# Patient Record
Sex: Female | Born: 1955 | Hispanic: No | Marital: Married | Smoking: Never smoker
Health system: Southern US, Community
[De-identification: ages and names within clinical notes are randomized; demographics above are authoritative.]

## PROBLEM LIST (undated history)

## (undated) DIAGNOSIS — G43909 Migraine, unspecified, not intractable, without status migrainosus: Secondary | ICD-10-CM

## (undated) DIAGNOSIS — I10 Essential (primary) hypertension: Secondary | ICD-10-CM

## (undated) HISTORY — DX: Migraine, unspecified, not intractable, without status migrainosus: G43.909

## (undated) HISTORY — PX: DILATION AND CURETTAGE OF UTERUS: SHX78

## (undated) HISTORY — DX: Essential (primary) hypertension: I10

---

## 2002-06-21 ENCOUNTER — Encounter: Admission: RE | Admit: 2002-06-21 | Discharge: 2002-06-21 | Payer: Self-pay | Admitting: Emergency Medicine

## 2002-06-21 ENCOUNTER — Encounter: Payer: Self-pay | Admitting: Emergency Medicine

## 2003-03-10 ENCOUNTER — Encounter: Payer: Self-pay | Admitting: Gastroenterology

## 2003-03-10 ENCOUNTER — Ambulatory Visit (HOSPITAL_COMMUNITY): Admission: RE | Admit: 2003-03-10 | Discharge: 2003-03-10 | Payer: Self-pay | Admitting: Gastroenterology

## 2005-03-11 ENCOUNTER — Encounter: Admission: RE | Admit: 2005-03-11 | Discharge: 2005-03-11 | Payer: Self-pay | Admitting: Emergency Medicine

## 2006-03-03 ENCOUNTER — Other Ambulatory Visit: Admission: RE | Admit: 2006-03-03 | Discharge: 2006-03-03 | Payer: Self-pay | Admitting: Obstetrics and Gynecology

## 2006-03-11 ENCOUNTER — Encounter: Admission: RE | Admit: 2006-03-11 | Discharge: 2006-03-11 | Payer: Self-pay | Admitting: Obstetrics and Gynecology

## 2006-04-25 ENCOUNTER — Encounter (INDEPENDENT_AMBULATORY_CARE_PROVIDER_SITE_OTHER): Payer: Self-pay | Admitting: Specialist

## 2006-04-25 ENCOUNTER — Ambulatory Visit (HOSPITAL_COMMUNITY): Admission: RE | Admit: 2006-04-25 | Discharge: 2006-04-25 | Payer: Self-pay | Admitting: Obstetrics and Gynecology

## 2007-12-24 ENCOUNTER — Ambulatory Visit (HOSPITAL_COMMUNITY): Admission: RE | Admit: 2007-12-24 | Discharge: 2007-12-24 | Payer: Self-pay | Admitting: Gastroenterology

## 2008-08-04 ENCOUNTER — Other Ambulatory Visit: Admission: RE | Admit: 2008-08-04 | Discharge: 2008-08-04 | Payer: Self-pay | Admitting: Obstetrics & Gynecology

## 2008-12-30 ENCOUNTER — Ambulatory Visit (HOSPITAL_COMMUNITY): Admission: RE | Admit: 2008-12-30 | Discharge: 2008-12-30 | Payer: Self-pay | Admitting: Obstetrics & Gynecology

## 2010-09-09 ENCOUNTER — Encounter: Payer: Self-pay | Admitting: Obstetrics & Gynecology

## 2010-09-09 ENCOUNTER — Encounter: Payer: Self-pay | Admitting: Gastroenterology

## 2011-01-04 NOTE — H&P (Signed)
Alexandria Lawrence, Alexandria Lawrence                 ACCOUNT NO.:  [id number]   MEDICAL RECORD NO.:  [id number]          PATIENT TYPE:  AMB   LOCATION:  SDC                           FACILITY:  [facility]   PHYSICIAN:  Naima A. Dillard, M.D. DATE OF BIRTH:  1956-04-09   DATE OF ADMISSION:  04/24/2006  DATE OF DISCHARGE:                                HISTORY & PHYSICAL   CHIEF COMPLAINT:  Irregular bleeding, submucosal fibroid.   PROCEDURE:  The patient is a 55 year old gravida 3, para 1 who presented on  July of 2007 stating that she has had pain with periods, lethargy and body  aches.  She is totally dysfunctional one week before period and also has  camping, bloating and headaches of three days into the period.  The patient  says that she has a period for seven days.  She changes her pad about four  times a day.  The cramps are about 3 to 7/10 and she does take Midol and  Advil which does decrease it to a 5/10.  The patient does not use any  contraception.  A submucosal fibroid was seen on sonohistogram.  She is not  on any hormone therapy.  She is not on any new medications for menopausal  symptoms.  Does not have any vaginal discharge.  She does have dysmenorrhea  and has increased stress because her son just moved back in from college.   PAST MEDICAL HISTORY:  Significant for migraines and hypercholesterolemia.   PAST SURGICAL HISTORY:  Significant for elective abortion x1 in the first  trimester.  Tobacco abuse.  She has under care for a vaginal delivery x1,  miscarriage x1 in the first trimester and elective abortion x1 in the first  trimester.   ALLERGIES:  The patient has no known drug allergies.   MEDICATIONS:  Topamax 2.5 mg q.h.s.   SOCIAL HISTORY:  She has occasional alcohol use.  No tobacco or illicit drug  use.   FAMILY HISTORY:  No diabetes.  Both parents have hypertension.  She has a  maternal aunt with breast cancer.   REVIEW OF SYSTEMS:  CARDIOVASCULAR:  She does have some heart  palpitations.  GI:  She has no constipation.  MUSCULOSKELETAL:  No weakness.  ENDOCRINE:  She has increased prolactin level.  PSYCHIATRIC:  Unremarkable.   PHYSICAL EXAMINATION:  VITAL SIGNS:  Blood pressure on repeat was 100/60.  Weight is 144 pounds.  Pupils are equal.  Hearing normal.  Throat is clear.  Thyroid is not  enlarged.  Heart has a regular rate and rhythm.  Lungs are clear to  auscultation bilaterally.  Back has no CVA tenderness bilaterally.  Abdomen  is nontender without any masses or organomegaly.  Extremities have no  clubbing, cyanosis, or edema.  Vaginal exam is within normal limits.  Cervix  is nontender without any lesions.  Uterus is normal size, mobile, nontender.  No adnexal masses.   ASSESSMENT:  Irregular vaginal bleeding with submucosal fibers,  perimenopausal with premenstrual syndrome.   PLAN:  A D&C hysteroscopy with removal of fibroids.  The patient was  given  Cytotec 200 mcg to place in her vagina six hours before the procedure.  The  patient declines any treatment for PMS.  The patient did have a slightly  increased prolactin level.  We will plan to check it preoperatively.  The  patient understands the risks are but not limited to bleeding, infection,  perforation of the uterus, need for expected surgery.      Naima A. Normand Sloop, M.D.  Electronically Signed     NAD/MEDQ  D:  04/24/2006  T:  04/24/2006  Job:  [id number]

## 2011-01-04 NOTE — Op Note (Signed)
Alexandria Lawrence, Alexandria Lawrence                 ACCOUNT NO.:  [id number]   MEDICAL RECORD NO.:  [id number]          PATIENT TYPE:  AMB   LOCATION:  SDC                           FACILITY:  [facility]   PHYSICIAN:  Naima A. Dillard, M.D. DATE OF BIRTH:  [birth year]   DATE OF PROCEDURE:  04/25/2006  DATE OF DISCHARGE:                                 OPERATIVE REPORT   PREOPERATIVE DIAGNOSES:  1. Irregular heavy vaginal bleeding.  2. Submucosal fibroid.   POSTOPERATIVE DIAGNOSES:  1. Irregular heavy vaginal bleeding.  2. Submucosal fibroid.  3. Cervical stenosis.   PROCEDURE:  D&C hysteroscopy, endometrial ablation with ThermaChoice.   SURGEON:  Naima A. Dillard, M.D.   ASSISTANT:  None.   ANESTHESIA:  General laryngeal mask airway and local.   SPECIMENS:  Endometrial curettings sent to pathology.   ESTIMATED BLOOD LOSS:  Minimal.   COMPLICATIONS:  None.   There was a 50 mL of deficit of LR. The patient went to PACU in stable  condition.   DESCRIPTION OF PROCEDURE:  The patient was taken to the operating room where  she was given general anesthesia, placed in dorsal lithotomy position,  prepped and draped in a normal sterile fashion. A bivalve speculum was  placed into the vagina, the anterior lip of the cervix was grasped with a  single tooth tenaculum. The cervix was found to be completely stenotic and  was able to place the sound in the uterus and the uterus did sound to 7.5  cm. The cervical os was 3.5 cm. The cervix was further dilated with [facility]  dilators. The hysteroscope was placed into the uterine cavity, both ostia  were seen. You could not really see the indentation of the submucosal  fibroid as well with the hysteroscope even when you decrease the pressure it  looked small, questionable indentation but no area that looked like it would  benefit from a hysteroscopic resection. The hysteroscope was removed and  endometrial curetting was done with a sharp curettage. The NovaSure  device  was then placed into the cervix after dilating up to a 27 but because the  uterus had a narrow cavity, the fan devicewould not deploy. I tried several  times to deploy the device, and it did not deploy. It would deploy outside  of the uterus so we knew the device was intact. I then looked again into the  uterus just to make sure that there were no perforations and there were no  perforations seen. We then obtained consent from her husband to use  ThermaChoice. A ThermaChoice device was then brought into the room.  Instructions were followed carefully and ThermaChoice ablation was done  without difficulty for 8 minutes. All  instruments were removed from the vagina. The tenaculum site had some  bleeding on the left side of the anterior lip of the cervix which was made  hemostatic with sliver nitrate. Sponge, lap and needle counts were correct.  The patient went to the recovery room in stable condition.      Naima A. Normand Sloop, M.D.  Electronically Signed  NAD/MEDQ  D:  04/25/2006  T:  04/25/2006  Job:  [id number]

## 2012-11-10 ENCOUNTER — Encounter: Payer: Self-pay | Admitting: *Deleted

## 2012-11-10 ENCOUNTER — Ambulatory Visit (INDEPENDENT_AMBULATORY_CARE_PROVIDER_SITE_OTHER): Payer: 59 | Admitting: Neurology

## 2012-11-10 ENCOUNTER — Encounter: Payer: Self-pay | Admitting: Neurology

## 2012-11-10 VITALS — BP 160/60 | HR 60 | Ht 64.5 in | Wt 149.0 lb

## 2012-11-10 DIAGNOSIS — R519 Headache, unspecified: Secondary | ICD-10-CM | POA: Insufficient documentation

## 2012-11-10 DIAGNOSIS — G43909 Migraine, unspecified, not intractable, without status migrainosus: Secondary | ICD-10-CM | POA: Insufficient documentation

## 2012-11-10 DIAGNOSIS — R51 Headache: Secondary | ICD-10-CM

## 2012-11-10 NOTE — Progress Notes (Signed)
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 She has had good results with prophylaxis with Topamax and symptomatic relief with Maxalt.  She returns for followup after last visit on 08/03/2012. She state she's noticed increasing frequency of headaches in the last 2 months. In the month of February she had 3 severe migraine headaches and has already had 7 severe migraines in March. She's also had 5 days of mild  headaches in February as well which responded to ibuprofen. She is taking Topamax 25 mg daily and takes Maxalt 10 mg for symptomatic relief. She is unable to identify clear triggers for her headaches except possibly  lack of sleep and some recent work stress. She also had blood pressure documented in office today of 153/86 and 160/100. She does not have diagnoses of hypertension but does admit that at last visit with her primary physician Dr. Waynard Edwards her blood pressure was slightly high. ROS: 14 system review of systems is positive only for headache. Physical Exam: General: well developed, well nourished, seated, in no evident distress Head: head normocephalic and atraumatic. Orohparynx benign Neck: supple with no carotid or supraclavicular bruits Cardiovascular: regular rate and rhythm, no murmurs  Neurologic Exam Mental Status: Awake and fully alert. Oriented to place and time. Recent and remote memory intact. Attention span, concentration and fund of knowledge appropriate. Mood and affect appropriate.  Cranial Nerves: Fundoscopic exam not done. Pupils equal, briskly reactive to light. Extraocular movements full without nystagmus. Visual fields full to confrontation. Hearing intact   Facial sensation intact. Face, tongue, palate move normally and symmetrically. Neck flexion and extension normal.  Motor: Normal bulk and tone. Normal strength in all tested extremity  muscles. Sensory.: intact to tough and pinprick and vibratory.  Coordination: Rapid alternating movements normal in all extremities. Finger-to-nose and heel-to-shin performed accurately bilaterally. Gait and Station: Arises from chair without difficulty. Stance is normal. Gait demonstrates normal stride length and balance .   Reflexes: 1+ and symmetric. Toes downgoing.     ASSESSMENT:: 57 year lady with Mixed migraine and tension headaches which seem to have gotten worse in the last couple of months New diagnosis of hypertension    PLAN: Increase Topamax to 25 mg twice a day if tolerated without side effects and if needed may increase the dose further. I discussed possible side effects with her and advised her to call me. Continue to use Maxalt for symptomatic relief and I gave her some samples of Relpax to guide her through to till her mail order prescription comes in. I advised her to participate in stresses relaxation activities like regular walking, exercise, meditation and yoga. I also encouraged her to sleep at least 8 hours every night. She was advised to maintain a headache diary as well as recall her blood pressure daily and follow up with her primary physician for treatment for hypertension. She was advised to return for followup with me in 3 months.

## 2012-11-10 NOTE — Patient Instructions (Signed)
She is having increasing frequency of headaches hands plan to increase Topamax to 50 twice a day and further is tolerated and needed. I gave her samples of Relpax 40 mg tablets to tried her over till her mail order prescription of Maxalt 10 mg comes back. I also encouraged her to increase participation in stresses relaxation at today's like regular exercise, swimming, meditation and yoga. I was also concerned about her blood pressure being elevated and have advised her to monitor it and make an appointment to see her primary physician to discuss starting antihypertensives. She was asked to return for followup in 3 months.

## 2012-11-11 ENCOUNTER — Other Ambulatory Visit: Payer: Self-pay

## 2012-11-11 MED ORDER — RIZATRIPTAN BENZOATE 10 MG PO TABS: 10.0000 mg | ORAL_TABLET | ORAL | Status: DC | PRN

## 2012-11-11 NOTE — Progress Notes (Signed)
This encounter was created in error - please disregard.

## 2013-08-11 ENCOUNTER — Telehealth: Payer: Self-pay | Admitting: *Deleted

## 2013-08-11 ENCOUNTER — Ambulatory Visit (INDEPENDENT_AMBULATORY_CARE_PROVIDER_SITE_OTHER): Payer: 59

## 2013-08-11 ENCOUNTER — Ambulatory Visit (INDEPENDENT_AMBULATORY_CARE_PROVIDER_SITE_OTHER): Payer: 59 | Admitting: Podiatry

## 2013-08-11 ENCOUNTER — Encounter: Payer: Self-pay | Admitting: Podiatry

## 2013-08-11 VITALS — BP 129/96 | HR 108 | Resp 16 | Ht 64.0 in | Wt 150.0 lb

## 2013-08-11 DIAGNOSIS — M7750 Other enthesopathy of unspecified foot: Secondary | ICD-10-CM

## 2013-08-11 DIAGNOSIS — M722 Plantar fascial fibromatosis: Secondary | ICD-10-CM

## 2013-08-11 MED ORDER — TRIAMCINOLONE ACETONIDE 10 MG/ML IJ SUSP
10.0000 mg | Freq: Once | INTRAMUSCULAR | Status: AC
  Administered 2013-08-11: 10 mg

## 2013-08-11 NOTE — Progress Notes (Signed)
   Subjective:    Patient ID: Alexandria Lawrence, female    DOB: Jan 14, 1956, 57 y.o.   MRN: [id number]  HPI Comments: i have a bone spur and plantar fasciitis N sharp  L left foot medial side of foot and plantar heel  D July  O when in [place] did a lot of walking with bad shoes on  C about the same  A first thing in the morning , after resting first step up and after being on it  T cortisone injection in October by dr duda and ice and stretches     Foot Pain Associated symptoms include coughing, fatigue, a fever, headaches and weakness.      Review of Systems  Constitutional: Positive for fever and fatigue.       Sweating   HENT:       Sore throat   Respiratory: Positive for cough.   Cardiovascular:       Pain in calf when walking   Musculoskeletal:       Difficulty walking   Neurological: Positive for weakness and headaches.  All other systems reviewed and are negative.       Objective:   Physical Exam        Assessment & Plan:

## 2013-08-11 NOTE — Progress Notes (Signed)
Subjective:     Patient ID: Alexandria Lawrence, female   DOB: Apr 01, 1956, 57 y.o.   MRN: [id number]  Foot Pain   patient presents stating I'm having severe pain in my left plantar heel of approximately 6 months duration. States it's very sore when she gets up in the morning and after periods of sitting and is also now developing pain in the medial side of the foot. Patient's daughter is getting married in May and she has to be better for this event   Review of Systems  All other systems reviewed and are negative.       Objective:   Physical Exam  Nursing note and vitals reviewed. Constitutional: She is oriented to person, place, and time.  Cardiovascular: Intact distal pulses.   Musculoskeletal: Normal range of motion.  Neurological: She is oriented to person, place, and time.  Skin: Skin is warm.   neurovascular status intact and pain to palpation left plantar heel at the insertion of the tendon into the calcaneus with inflammation and fluid buildup. I also noted there to be discomfort at the posterior tibial insertion left with no muscle strength loss or range of motion loss. Patient does have a fever today that I made her aware of     Assessment:     Acute plantar fasciitis left with inflammation and compensation creating posterior tibial tendinitis left    Plan:     H&P reviewed with patient and today I injected the left plantar fascia 3 mg Kenalog 5 mg Xylocaine Marcaine mixture and dispensed night splint with all instructions on usage. Reappoint in 1 week to reevaluate and patient will take it easy today due to fever and see her family physician if any issues should occur with that

## 2013-08-11 NOTE — Patient Instructions (Addendum)
Plantar Fasciitis (Heel Spur Syndrome) with Rehab The plantar fascia is a fibrous, ligament-like, soft-tissue structure that spans the bottom of the foot. Plantar fasciitis is a condition that causes pain in the foot due to inflammation of the tissue. SYMPTOMS   Pain and tenderness on the underneath side of the foot.  Pain that worsens with standing or walking. CAUSES  Plantar fasciitis is caused by irritation and injury to the plantar fascia on the underneath side of the foot. Common mechanisms of injury include:  Direct trauma to bottom of the foot.  Damage to a small nerve that runs under the foot where the main fascia attaches to the heel bone.  Stress placed on the plantar fascia due to bone spurs. RISK INCREASES WITH:   Activities that place stress on the plantar fascia (running, jumping, pivoting, or cutting).  Poor strength and flexibility.  Improperly fitted shoes.  Tight calf muscles.  Flat feet.  Failure to warm-up properly before activity.  Obesity. PREVENTION  Warm up and stretch properly before activity.  Allow for adequate recovery between workouts.  Maintain physical fitness:  Strength, flexibility, and endurance.  Cardiovascular fitness.  Maintain a health body weight.  Avoid stress on the plantar fascia.  Wear properly fitted shoes, including arch supports for individuals who have flat feet. PROGNOSIS  If treated properly, then the symptoms of plantar fasciitis usually resolve without surgery. However, occasionally surgery is necessary. RELATED COMPLICATIONS   Recurrent symptoms that may result in a chronic condition.  Problems of the lower back that are caused by compensating for the injury, such as limping.  Pain or weakness of the foot during push-off following surgery.  Chronic inflammation, scarring, and partial or complete fascia tear, occurring more often from repeated injections. TREATMENT  Treatment initially involves the use of  ice and medication to help reduce pain and inflammation. The use of strengthening and stretching exercises may help reduce pain with activity, especially stretches of the Achilles tendon. These exercises may be performed at home or with a therapist. Your caregiver may recommend that you use heel cups of arch supports to help reduce stress on the plantar fascia. Occasionally, corticosteroid injections are given to reduce inflammation. If symptoms persist for greater than 6 months despite non-surgical (conservative), then surgery may be recommended.  MEDICATION   If pain medication is necessary, then nonsteroidal anti-inflammatory medications, such as aspirin and ibuprofen, or other minor pain relievers, such as acetaminophen, are often recommended.  Do not take pain medication within 7 days before surgery.  Prescription pain relievers may be given if deemed necessary by your caregiver. Use only as directed and only as much as you need.  Corticosteroid injections may be given by your caregiver. These injections should be reserved for the most serious cases, because they may only be given a certain number of times. HEAT AND COLD  Cold treatment (icing) relieves pain and reduces inflammation. Cold treatment should be applied for 10 to 15 minutes every 2 to 3 hours for inflammation and pain and immediately after any activity that aggravates your symptoms. Use ice packs or massage the area with a piece of ice (ice massage).  Heat treatment may be used prior to performing the stretching and strengthening activities prescribed by your caregiver, physical therapist, or athletic trainer. Use a heat pack or soak the injury in warm water. SEEK IMMEDIATE MEDICAL CARE IF:  Treatment seems to offer no benefit, or the condition worsens.  Any medications produce adverse side effects. EXERCISES RANGE   OF MOTION (ROM) AND STRETCHING EXERCISES - Plantar Fasciitis (Heel Spur Syndrome) These exercises may help you  when beginning to rehabilitate your injury. Your symptoms may resolve with or without further involvement from your physician, physical therapist or athletic trainer. While completing these exercises, remember:   Restoring tissue flexibility helps normal motion to return to the joints. This allows healthier, less painful movement and activity.  An effective stretch should be held for at least 30 seconds.  A stretch should never be painful. You should only feel a gentle lengthening or release in the stretched tissue. RANGE OF MOTION - Toe Extension, Flexion  Sit with your right / left leg crossed over your opposite knee.  Grasp your toes and gently pull them back toward the top of your foot. You should feel a stretch on the bottom of your toes and/or foot.  Hold this stretch for __________ seconds.  Now, gently pull your toes toward the bottom of your foot. You should feel a stretch on the top of your toes and or foot.  Hold this stretch for __________ seconds. Repeat __________ times. Complete this stretch __________ times per day.  RANGE OF MOTION - Ankle Dorsiflexion, Active Assisted  Remove shoes and sit on a chair that is preferably not on a carpeted surface.  Place right / left foot under knee. Extend your opposite leg for support.  Keeping your heel down, slide your right / left foot back toward the chair until you feel a stretch at your ankle or calf. If you do not feel a stretch, slide your bottom forward to the edge of the chair, while still keeping your heel down.  Hold this stretch for __________ seconds. Repeat __________ times. Complete this stretch __________ times per day.  STRETCH  Gastroc, Standing  Place hands on wall.  Extend right / left leg, keeping the front knee somewhat bent.  Slightly point your toes inward on your back foot.  Keeping your right / left heel on the floor and your knee straight, shift your weight toward the wall, not allowing your back to  arch.  You should feel a gentle stretch in the right / left calf. Hold this position for __________ seconds. Repeat __________ times. Complete this stretch __________ times per day. STRETCH  Soleus, Standing  Place hands on wall.  Extend right / left leg, keeping the other knee somewhat bent.  Slightly point your toes inward on your back foot.  Keep your right / left heel on the floor, bend your back knee, and slightly shift your weight over the back leg so that you feel a gentle stretch deep in your back calf.  Hold this position for __________ seconds. Repeat __________ times. Complete this stretch __________ times per day. STRETCH  Gastrocsoleus, Standing  Note: This exercise can place a lot of stress on your foot and ankle. Please complete this exercise only if specifically instructed by your caregiver.   Place the ball of your right / left foot on a step, keeping your other foot firmly on the same step.  Hold on to the wall or a rail for balance.  Slowly lift your other foot, allowing your body weight to press your heel down over the edge of the step.  You should feel a stretch in your right / left calf.  Hold this position for __________ seconds.  Repeat this exercise with a slight bend in your right / left knee. Repeat __________ times. Complete this stretch __________ times per day.    STRENGTHENING EXERCISES - Plantar Fasciitis (Heel Spur Syndrome)  These exercises may help you when beginning to rehabilitate your injury. They may resolve your symptoms with or without further involvement from your physician, physical therapist or athletic trainer. While completing these exercises, remember:   Muscles can gain both the endurance and the strength needed for everyday activities through controlled exercises.  Complete these exercises as instructed by your physician, physical therapist or athletic trainer. Progress the resistance and repetitions only as guided. STRENGTH - Towel  Curls  Sit in a chair positioned on a non-carpeted surface.  Place your foot on a towel, keeping your heel on the floor.  Pull the towel toward your heel by only curling your toes. Keep your heel on the floor.  If instructed by your physician, physical therapist or athletic trainer, add ____________________ at the end of the towel. Repeat __________ times. Complete this exercise __________ times per day. STRENGTH - Ankle Inversion  Secure one end of a rubber exercise band/tubing to a fixed object (table, pole). Loop the other end around your foot just before your toes.  Place your fists between your knees. This will focus your strengthening at your ankle.  Slowly, pull your big toe up and in, making sure the band/tubing is positioned to resist the entire motion.  Hold this position for __________ seconds.  Have your muscles resist the band/tubing as it slowly pulls your foot back to the starting position. Repeat __________ times. Complete this exercises __________ times per day.  Document Released: 08/05/2005 Document Revised: 10/28/2011 Document Reviewed: 11/17/2008 [employer] Patient Information 2014 [employer]. Plantar Fasciitis Plantar fasciitis is a common condition that causes foot pain. It is soreness (inflammation) of the band of tough fibrous tissue on the bottom of the foot that runs from the heel bone (calcaneus) to the ball of the foot. The cause of this soreness may be from excessive standing, poor fitting shoes, running on hard surfaces, being overweight, having an abnormal walk, or overuse (this is common in runners) of the painful foot or feet. It is also common in aerobic exercise dancers and ballet dancers. SYMPTOMS  Most people with plantar fasciitis complain of:  Severe pain in the morning on the bottom of their foot especially when taking the first steps out of bed. This pain recedes after a few minutes of walking.  Severe pain is experienced also during walking  following a long period of inactivity.  Pain is worse when walking barefoot or up stairs DIAGNOSIS   Your caregiver will diagnose this condition by examining and feeling your foot.  Special tests such as X-rays of your foot, are usually not needed. PREVENTION   Consult a sports medicine professional before beginning a new exercise program.  Walking programs offer a good workout. With walking there is a lower chance of overuse injuries common to runners. There is less impact and less jarring of the joints.  Begin all new exercise programs slowly. If problems or pain develop, decrease the amount of time or distance until you are at a comfortable level.  Wear good shoes and replace them regularly.  Stretch your foot and the heel cords at the back of the ankle (Achilles tendon) both before and after exercise.  Run or exercise on even surfaces that are not hard. For example, asphalt is better than pavement.  Do not run barefoot on hard surfaces.  If using a treadmill, vary the incline.  Do not continue to workout if you have foot or joint   problems. Seek professional help if they do not improve. HOME CARE INSTRUCTIONS   Avoid activities that cause you pain until you recover.  Use ice or cold packs on the problem or painful areas after working out.  Only take over-the-counter or prescription medicines for pain, discomfort, or fever as directed by your caregiver.  Soft shoe inserts or athletic shoes with air or gel sole cushions may be helpful.  If problems continue or become more severe, consult a sports medicine caregiver or your own health care provider. Cortisone is a potent anti-inflammatory medication that may be injected into the painful area. You can discuss this treatment with your caregiver. MAKE SURE YOU:   Understand these instructions.  Will watch your condition.  Will get help right away if you are not doing well or get worse. Document Released: 04/30/2001 Document  Revised: 10/28/2011 Document Reviewed: 06/29/2008 [employer] Patient Information 2014 [employer].  

## 2013-08-11 NOTE — Telephone Encounter (Signed)
Pt states she was seen today, but the rx was not called to her pharmacy.  Dr Charlsie Merles states since she was sick, he did not prescribe any medications, encourage ice 10 minutes 3 - 4 times a day.  Orders to the pt.  Pt states understanding.

## 2013-08-16 ENCOUNTER — Telehealth: Payer: Self-pay | Admitting: *Deleted

## 2013-08-16 NOTE — Telephone Encounter (Signed)
Pt complains of cramping in her left calf when in the nightsplint.  Dr Charlsie Merles states only use the nightsplint when resting and apply a warm moist compress to the left calf 3 to 4 times a day.  I informed the pt's husband, because he said she was not home.

## 2013-08-23 ENCOUNTER — Ambulatory Visit: Payer: 59 | Admitting: Podiatry

## 2013-08-25 ENCOUNTER — Encounter: Payer: Self-pay | Admitting: Podiatry

## 2013-08-25 ENCOUNTER — Ambulatory Visit (INDEPENDENT_AMBULATORY_CARE_PROVIDER_SITE_OTHER): Payer: 59 | Admitting: Podiatry

## 2013-08-25 VITALS — BP 145/86 | HR 77 | Resp 16

## 2013-08-25 DIAGNOSIS — M722 Plantar fascial fibromatosis: Secondary | ICD-10-CM

## 2013-08-25 MED ORDER — TRIAMCINOLONE ACETONIDE 10 MG/ML IJ SUSP
10.0000 mg | Freq: Once | INTRAMUSCULAR | Status: AC
  Administered 2013-08-25: 10 mg

## 2013-08-25 MED ORDER — DICLOFENAC SODIUM 75 MG PO TBEC: 75.0000 mg | DELAYED_RELEASE_TABLET | Freq: Two times a day (BID) | ORAL | Status: DC

## 2013-08-25 NOTE — Progress Notes (Signed)
Subjective:     Patient ID: Alexandria Lawrence, female   DOB: 11-06-1955, 58 y.o.   MRN: [id number]  HPI patient states that she is some improved but still having quite a bit discomfort in her plantar heel   Review of Systems     Objective:   Physical Exam Neurovascular status intact with no health history changes noted with continued discomfort in the plantar fascia upon palpation    Assessment:     Continued plantar fasciitis with mild improvement at this point    Plan:     Discussed acute and chronic nature of plantar fasciitis and scanned for custom orthotic devices. Injected the plantar fascia today 3 mg Kenalog 5 mg Xylocaine Marcaine mixture and reappoint when orthotics returned using night splint until that time

## 2013-09-23 ENCOUNTER — Encounter: Payer: Self-pay | Admitting: Podiatry

## 2013-09-23 ENCOUNTER — Ambulatory Visit (INDEPENDENT_AMBULATORY_CARE_PROVIDER_SITE_OTHER): Payer: 59 | Admitting: Podiatry

## 2013-09-23 VITALS — BP 145/86 | HR 72 | Resp 16

## 2013-09-23 DIAGNOSIS — M722 Plantar fascial fibromatosis: Secondary | ICD-10-CM

## 2013-09-23 NOTE — Patient Instructions (Signed)

## 2013-09-23 NOTE — Progress Notes (Signed)
Subjective:     Patient ID: Alexandria Lawrence, female   DOB: [birth year], 58 y.o.   MRN: [id number]  HPI patient presents stating my heel is feeling better at this time with mild discomfort with prolonged activity   Review of Systems     Objective:   Physical Exam Neurovascular status intact with no health history changes noted and discomfort of a mild nature plantar heel    Assessment:     Improved plantar fasciitis noted    Plan:     Advised on physical therapy and dispensed orthotics with all instructions usage at the current time

## 2013-11-04 ENCOUNTER — Encounter: Payer: Self-pay | Admitting: Podiatry

## 2013-11-04 ENCOUNTER — Ambulatory Visit (INDEPENDENT_AMBULATORY_CARE_PROVIDER_SITE_OTHER): Payer: 59 | Admitting: Podiatry

## 2013-11-04 VITALS — BP 145/86 | HR 72 | Resp 16

## 2013-11-04 DIAGNOSIS — M722 Plantar fascial fibromatosis: Secondary | ICD-10-CM

## 2013-11-05 NOTE — Progress Notes (Signed)
Subjective:     Patient ID: Alexandria Lawrence, female   DOB: May 02, 1956, 58 y.o.   MRN: [id number]  HPI patient states my foot is not completely healed but I am improving on the left   Review of Systems     Objective:   Physical Exam Neurovascular status intact with continued mild to moderate discomfort plantar heel left    Assessment:     Plantar fasciitis left still present but improved    Plan:     Continue orthotics and discussed physical therapy and exercises to do reappoint as needed

## 2014-02-07 ENCOUNTER — Encounter: Payer: Self-pay | Admitting: Podiatry

## 2014-02-07 ENCOUNTER — Ambulatory Visit (INDEPENDENT_AMBULATORY_CARE_PROVIDER_SITE_OTHER): Payer: 59 | Admitting: Podiatry

## 2014-02-07 VITALS — BP 125/78 | HR 75 | Resp 16

## 2014-02-07 DIAGNOSIS — B351 Tinea unguium: Secondary | ICD-10-CM

## 2014-02-07 DIAGNOSIS — L600 Ingrowing nail: Secondary | ICD-10-CM

## 2014-02-07 NOTE — Progress Notes (Signed)
   Subjective:    Patient ID: Alexandria Lawrence, female    DOB: 04-24-1956, 58 y.o.   MRN: [id number]  HPI  Painful right great toenail. Pain has been off and on for about one month. No drainage noted, mild swelling and erythema, possible ingrown. Pt states that she has been using an otc fungal treatment but hasnt noticed any improvement.  Review of Systems     Objective:   Physical Exam        Assessment & Plan:

## 2014-02-07 NOTE — Progress Notes (Signed)
Subjective:     Patient ID: Alexandria Lawrence, female   DOB: May 03, 1956, 58 y.o.   MRN: [id number]  HPI patient presents stating my right big toe on border towards my second toe has been sore and it had some drainage and I also have a thick toenail which has never been right on that toe   Review of Systems     Objective:   Physical Exam Neurovascular status is intact no health history changes noted with incurvated right hallux lateral border and thickness to the underlying nailbed itself    Assessment:     Ingrown toenail deformity right hallux lateral border and mycotic nail infection    Plan:     Reviewed condition and explained removal of corner in the prominent fashion. Patient wants procedure and I did explain that the rest of the nail is also not helping we will try a topical antifungal which she may lose the toenail eventually. Infiltrated the right hallux 60 mg Xylocaine Marcaine mixture and remove the lateral border exposing matrix. Applied phenol 3 applications 30 seconds and applied alcohol lavaged and sterile dressing and instructed on soaks and reappoint

## 2014-02-07 NOTE — Patient Instructions (Signed)

## 2014-02-08 ENCOUNTER — Telehealth: Payer: Self-pay | Admitting: *Deleted

## 2014-02-08 MED ORDER — HYDROCODONE-ACETAMINOPHEN 10-325 MG PO TABS
1.0000 | ORAL_TABLET | Freq: Four times a day (QID) | ORAL | Status: DC | PRN
Start: 2014-02-08 — End: 2019-12-28

## 2014-02-08 NOTE — Telephone Encounter (Signed)
I called and informed the patient that Dr. Charlsie Merlesegal wrote her a prescription for Vicodin.  I told her she can come by tomorrow to pick it up.  She asked what can she do about it tonight, that's when it hurts the most.  I told her she could take Ibuprofen or Advil if she can tolerate it.  She asked how many.  I told her she can take up to 800mg  of Ibuprofen.  She asked if there was anything else she could take in between Ibuprofen and a narcotic.  I told her no.  She said she had already soaked it for the day.  I asked if she had soaked it twice today.  She said no.  I advised her to soak it again.  She said she would before she goes to bed.  She said he didn't take the whole nail off.  It's going to be catching on to things.  I told her it would be all right, the skin will grow back around the nail.  She said well okay.

## 2014-02-08 NOTE — Telephone Encounter (Signed)
Patient called again.  She stated she is in a lot of pain.  I told her I'm waiting on a response from Dr. Charlsie Merlesegal, he's in surgery right now.  She asked if another doctor could call her in something.  I told her no, Dr. Charlsie Merlesegal would have to prescribe it.  I asked her if she could tolerate Advil or Ibuprofen.  She stated she has been taking that and it hasn't helped.  I asked her if she had soaked her foot yet.  She stated no, because she thought it might make it hurt worse.  I told her it actually might make it feel better.  She asked if I knew how long he would be in surgery.  I told her I don't know.  She started to give me pharmacy information.  I told her that the prescription would not be called in to a pharmacy because it's a narcotic.  I told her she would have to come by the office to pick it up.  She asked if I would call her once I got a response from Dr. Charlsie Merlesegal.  I told her yes.

## 2014-02-08 NOTE — Telephone Encounter (Signed)
vicodin 10/325   #25   [id number]

## 2014-02-08 NOTE — Telephone Encounter (Signed)
There yesterday, he removed my toenail.  I've got a lot of pain.  I couldn't sleep all night.  I need something for the pain and swelling.  Please call me back.

## 2014-06-28 ENCOUNTER — Other Ambulatory Visit: Payer: Self-pay | Admitting: Neurology

## 2014-06-28 MED ORDER — ELETRIPTAN HYDROBROMIDE 40 MG PO TABS: 40.0000 mg | ORAL_TABLET | ORAL | Status: DC | PRN

## 2014-08-20 ENCOUNTER — Other Ambulatory Visit: Payer: Self-pay | Admitting: Neurology

## 2014-08-22 NOTE — Telephone Encounter (Signed)
Patient has not been seen in almost 2 years.  I called cell, got no answer, voicemail not set up, unable to leave message.  Called home.  Left message.

## 2014-08-30 ENCOUNTER — Other Ambulatory Visit: Payer: Self-pay | Admitting: Neurology

## 2014-08-30 MED ORDER — ELETRIPTAN HYDROBROMIDE 40 MG PO TABS: 40.0000 mg | ORAL_TABLET | ORAL | Status: DC | PRN

## 2014-10-04 ENCOUNTER — Other Ambulatory Visit: Payer: Self-pay | Admitting: Neurology

## 2014-10-05 NOTE — Telephone Encounter (Signed)
Patient has not been seen in almost 2 years.  I called, got no answer.  Left message.

## 2014-10-12 ENCOUNTER — Telehealth: Payer: Self-pay | Admitting: *Deleted

## 2014-10-12 NOTE — Telephone Encounter (Signed)
Called patient to schedule appointment with NP, per Dr Pearlean BrownieSethi. Patient will need to be seen by NP before Rx Maxalt can be refilled.

## 2014-10-31 ENCOUNTER — Ambulatory Visit (INDEPENDENT_AMBULATORY_CARE_PROVIDER_SITE_OTHER): Payer: 59 | Admitting: Neurology

## 2014-10-31 DIAGNOSIS — G5731 Lesion of lateral popliteal nerve, right lower limb: Secondary | ICD-10-CM | POA: Diagnosis not present

## 2014-10-31 DIAGNOSIS — Z0289 Encounter for other administrative examinations: Secondary | ICD-10-CM

## 2014-10-31 MED ORDER — ELETRIPTAN HYDROBROMIDE 40 MG PO TABS: 40.0000 mg | ORAL_TABLET | ORAL | Status: DC | PRN

## 2014-10-31 NOTE — Progress Notes (Signed)
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 She has had good results with prophylaxis with Topamax and symptomatic relief with Maxalt.  She returns for followup after last visit on 08/03/2012. She state she's noticed increasing frequency of headaches in the last 2 months. In the month of February she had 3 severe migraine headaches and has already had 7 severe migraines in March. She's also had 5 days of mild  headaches in February as well which responded to ibuprofen. She is taking Topamax 25 mg daily and takes Maxalt 10 mg for symptomatic relief. She is unable to identify clear triggers for her headaches except possibly  lack of sleep and some recent work stress. She also had blood pressure documented in office today of 153/86 and 160/100. She does not have diagnoses of hypertension but does admit that at last visit with her primary physician Dr. Waynard Edwards her blood pressure was slightly high. Update 10/31/2014 : Patient was worked into the schedule today at the end of the day as she could only come after 5 PM. She states her migraine headaches have been doing quite well and she has in fact not had any headaches for the last 2 months and not taken any Relpax. She has discontinued the Topamax as she felt she did not need it and was working quite well. She has recently been started on valsartan 80 mg and propranolol 120 mg daily    by Dr. Jacinto Halim for hypertension. She is reluctant to take more medications. She has an upcoming wedding in the family at this likely going to go through a stressful period. ROS: 14 system review of systems is positive only for headache. Physical Exam: General: well developed, well nourished, seated, in no evident distress Head: head normocephalic and atraumatic. Orohparynx benign Neck: supple with no carotid or supraclavicular bruits Cardiovascular: regular rate and  rhythm, no murmurs  Neurologic Exam Mental Status: Awake and fully alert. Oriented to place and time. Recent and remote memory intact. Attention span, concentration and fund of knowledge appropriate. Mood and affect appropriate.  Cranial Nerves: Fundoscopic exam not done. Pupils equal, briskly reactive to light. Extraocular movements full without nystagmus. Visual fields full to confrontation. Hearing intact   Facial sensation intact. Face, tongue, palate move normally and symmetrically. Neck flexion and extension normal.  Motor: Normal bulk and tone. Normal strength in all tested extremity muscles. Sensory.: intact to touch and pinprick and vibratory. Positive Tinel's sign over the right fibular head Coordination: Rapid alternating movements normal in all extremities. Finger-to-nose and heel-to-shin performed accurately bilaterally. Gait and Station: Arises from chair without difficulty. Stance is normal. Gait demonstrates normal stride length and balance .   Reflexes: 1+ and symmetric. Toes downgoing.     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 She was given a refill for Relpax. She was advised to sleep with a soft pillow underneath her right knee to limit pressure on the right common peroneal nerve. She was also given exercises to stretch the leg muscles. Return for  follow-up in 6 months or call earlier if necessary

## 2014-10-31 NOTE — Patient Instructions (Addendum)
She was advised to continue Relpax for symptomatic relief of her migraines but to limit it to not more than twice a week. She was given a refill for Relpax. She was advised to sleep with a soft pillow underneath her right knee to limit pressure on the right common peroneal nerve. She was also given exercises to stretch the leg muscles. Common Peroneal Nerve Entrapment with Rehab The peroneal nerve and its branches are responsible for muscle control of the muscles that extend to the toes, foot, ankle. This nerve is also responsible for sensation on the outer side of the lower leg and foot. Injury to the peroneal nerve results in problems with sensation and muscle control in these areas. Injury to the peroneal nerve often occurs in the area where the nerve passes around the top of one of the lower leg bones (fibular head). The nerve becomes trapped, causing pain, tingling, numbness, or burning sensations. SYMPTOMS   Pain, tingling, numbness, or burning on the top of the foot, ankle, or outer part of the lower leg.  Pain that gets worse with physical activity (walking, running, squatting).  Weakness when lifting the foot, including moving the ankle and toes upward (foot drop), or turning the foot outward with walking.  Problems walking (having to lift the foot high) or running, including tripping over the foot.  Inflammation, bruising (contusion), and tenderness near the outer part of the knee (or just below the knee). CAUSES  Common peroneal nerve entrapment is caused by pressure being placed on the peroneal nerve. This pressure may occur due to direct contact (being tackled at the knees), inflammation, a cyst in the knee, or a healing fracture around the knee. Less commonly, peroneal nerve entrapment may be caused by a stretch injury (knee sprain) or with swelling in the leg (compartment syndrome). RISK INCREASES WITH:  Recurring foot, ankle, or knee sprains.  Playing sports on uneven ground,  which may result in knee or ankle sprains.  Direct injury (trauma) to the knee. PREVENTION  Warm up and stretch properly before activity.  Maintain physical fitness:  Strength, flexibility, and endurance.  Cardiovascular fitness.  Wear properly fitted and padded protective equipment. PROGNOSIS  Common peroneal nerve entrapment is often curable with non-surgical treatment. Often symptoms will go away on their own (spontaneously). Sometimes, surgery is needed to relieve pressure from the nerve.  RELATED COMPLICATIONS   Permanent pain, tingling, numbness, or weakness of the affected foot, ankle, and leg.  Inability to compete, due to pain or weakness.  Injury to other parts of the body, as a result of repeated tripping and falling over the foot. TREATMENT Treatment first involves resting from any activities that cause the symptoms to get worse. The use of ice and medicine may reduce pain and inflammation. If ice is used, do not place it directly on the skin. Instead, place a towel in-between. If there is weakness of the muscles, causing foot drop, bracing the ankle and foot may be needed. It is important to perform strengthening and stretching exercises to maintain muscle strength. These exercises may be completed at home or with a therapist. If pain continues to get worse, despite treatment, or a cyst is present, surgery may be needed to relieve the pressure on the nerve. If the pressure on the nerve is due to compartment syndrome, a fascial (sheet of connective tissue) release may need to be performed. The earlier surgery is performed, the better your chances of full recovery.  MEDICATION   If pain  medicine is needed, nonsteroidal anti-inflammatory medicines (aspirin and ibuprofen), or other minor pain relievers (acetaminophen), are often advised.  Do not take pain medicine for 7 days before surgery.  Prescription pain relievers may be given if your caregiver thinks they are needed. Use  only as directed and only as much as you need. COLD THERAPY   Cold treatment (icing) should be applied for 10 to 15 minutes every 2 to 3 hours for inflammation and pain, and immediately after activity that aggravates your symptoms. Use ice packs or an ice massage. SEEK MEDICAL CARE IF:   Symptoms get worse.  Symptoms do not improve in 2 weeks, despite treatment.  New, unexplained symptoms develop. (Drugs used in treatment may produce side effects.) EXERCISES RANGE OF MOTION (ROM) AND STRETCHING EXERCISES - Common Peroneal Nerve Entrapment These exercises may help you when beginning to rehabilitate your injury. Your symptoms may go away with or without further involvement from your physician, physical therapist or athletic trainer. While completing these exercises, remember:   Restoring tissue flexibility helps normal motion to return to the joints. This allows healthier, less painful movement and activity.  An effective stretch should be held for at least 30 seconds.  A stretch should never be painful. You should only feel a gentle lengthening or release in the stretched tissue. RANGE OF MOTION - Ankle Eversion   Sit with your right / left ankle crossed over your opposite knee.  Grip your foot with your opposite hand, placing your thumb on the top of your foot and your fingers across the bottom of your foot.  Gently push your foot downward with a slight rotation, so your littlest toes rise slightly toward the ceiling.  You should feel a gentle stretch on the inside of your ankle. Hold the stretch for __________ seconds. Repeat __________ times. Complete this exercise __________ times per day.  RANGE OF MOTION - Ankle Inversion   Sit with your right / left ankle crossed over your opposite knee.  Grip your foot with your opposite hand, placing your thumb on the bottom of your foot and your fingers across the top of your foot.  Gently pull your foot so the smallest toe comes toward  you and your thumb pushes the inside of the ball of your foot away from you.  You should feel a gentle stretch on the outside of your ankle. Hold the stretch for __________ seconds. Repeat __________ times. Complete this exercise __________ times per day.  RANGE OF MOTION - Ankle Dorsiflexion, Active Assisted   Remove your shoes and sit on a chair, preferably not on a carpeted surface.  Place your right / left foot directly under your knee. Extend your opposite leg for support.  Keeping your heel down, slide your right / left foot back toward the chair, until you feel a stretch at your ankle or calf. If you do not feel a stretch, slide your bottom forward to the edge of the chair, while still keeping your heel down.  Hold this stretch for __________ seconds. Repeat __________ times. Complete this stretch __________ times per day.  STRETCH - Gastroc, Standing   Place your hands on a wall.  Extend your right / left leg behind you, and place a folded washcloth under the arch of your foot for support. Keep the front knee somewhat bent.  Slightly point your toes inward on your back foot.  Keeping your right / left heel on the floor and your knee straight, shift your weight toward  the wall, not allowing your back to arch.  You should feel a gentle stretch in the right / left calf. Hold this position for __________ seconds. Repeat __________ times. Complete this stretch __________ times per day. STRETCH - Soleus, Standing   Place your hands on a wall.  Extend your right / left leg behind you, and place a folded washcloth under the arch of your foot for support. Keep the front knee somewhat bent.  Slightly point your toes inward on your back foot.  Keep your right / left heel on the floor, bend your back knee, and slightly shift your weight over the back leg, so that you feel a gentle stretch deep in your back calf.  Hold this position for __________ seconds. Repeat __________ times.  Complete this stretch __________ times per day. STRETCH - Hamstrings, Standing  Stand or sit and extend your right / left leg, placing your foot on a chair or foot stool, keeping a slight arch in your low back and your hips straight forward.  Lead with your chest and lean forward at the waist, until you feel a gentle stretch in the back of your right / left knee or thigh. (When done correctly, this exercise requires leaning only a small distance.)  Hold this position for __________ seconds. Repeat __________ times. Complete this stretch __________ times per day. STRENGTHENING EXERCISES - Common Peroneal Nerve Entrapment These exercises may help you when beginning to rehabilitate your injury. They may resolve your symptoms with or without further involvement from your physician, physical therapist or athletic trainer. While completing these exercises, remember:   Muscles can gain both the endurance and the strength needed for everyday activities through controlled exercises.  Complete these exercises as instructed by your physician, physical therapist or athletic trainer. Increase the resistance and repetitions only as guided.  You may experience muscle soreness or fatigue, but the pain or discomfort you are trying to eliminate should never worsen during these exercises. If this pain does get worse, stop and make sure you are following the directions exactly. If the pain is still present after adjustments, discontinue the exercise until you can discuss the trouble with your caregiver. STRENGTH - Dorsiflexors  Secure a rubber exercise band or tubing to a fixed object (table, pole) and loop the other end around your right / left foot.  Sit on the floor facing the fixed object. The band should be slightly tense when your foot is relaxed.  Slowly draw your foot back toward you, using your ankle and toes.  Hold this position for __________ seconds. Slowly release the tension in the band and return  your foot to the starting position. Repeat __________ times. Complete this exercise __________ times per day.  STRENGTH - Ankle Eversion   Secure one end of a rubber exercise band or tubing to a fixed object (table, pole). Loop the other end around your foot, just before your toes.  Place your fists between your knees. This will focus your strengthening at your ankle.  Drawing the band across your opposite foot, away from the pole, slowly, pull your little toe out and up. Make sure the band is positioned to resist the entire motion.  Hold this position for __________ seconds.  Return to the starting position slowly, controlling the tension in the band. Repeat __________ times. Complete this exercise __________ times per day.  STRENGTH - Ankle Inversion   Secure one end of a rubber exercise band or tubing to a fixed object (table,  pole). Loop the other end around your foot, just before your toes.  Place your fists between your knees. This will focus your strengthening at your ankle.  Slowly, pull your big toe up and in, making sure the band is positioned to resist the entire motion.  Hold this position for __________ seconds.  Return to the starting position slowly, controlling the tension in the band. Repeat __________ times. Complete this exercises __________ times per day.  Document Released: 08/05/2005 Document Revised: 11/30/2012 Document Reviewed: 11/17/2008 [facility] Patient Information 2015 [place], [place]. This information is not intended to replace advice given to you by your health care provider. Make sure you discuss any questions you have with your health care provider.  She may go back on Topamax 50 mg daily for migraine prophylaxis if headache frequency increases to more than once per week in the future. She was also advised to sleep with a soft pillow underneath her right knee to avoid entrapment neuropathy of the right peroneal nerve. Return for follow-up in 6 months or  call earlier if necessary

## 2014-11-01 ENCOUNTER — Encounter: Payer: Self-pay | Admitting: Neurology

## 2014-11-07 ENCOUNTER — Other Ambulatory Visit: Payer: Self-pay | Admitting: Neurology

## 2014-11-07 MED ORDER — TOPIRAMATE 25 MG PO TABS: 25.0000 mg | ORAL_TABLET | Freq: Two times a day (BID) | ORAL | Status: DC

## 2015-01-09 ENCOUNTER — Telehealth: Payer: Self-pay | Admitting: *Deleted

## 2015-01-09 NOTE — Telephone Encounter (Signed)
Attempting to schedule her 6 month FU with Dr Pearlean BrownieSethi. Called patient's mobile #, unable to leave message , re: voice mailbox not set up. Called home number; no answer, no answering machine. Called patient's work number, received message re: she is out of office 12/29/14 until 01/23/15. Will call her after 01/23/15 to schedule.

## 2016-04-26 ENCOUNTER — Other Ambulatory Visit: Payer: Self-pay | Admitting: Family Medicine

## 2016-04-26 DIAGNOSIS — Z1231 Encounter for screening mammogram for malignant neoplasm of breast: Secondary | ICD-10-CM

## 2016-06-18 ENCOUNTER — Telehealth: Payer: Self-pay | Admitting: Radiology

## 2016-06-18 NOTE — Telephone Encounter (Signed)
Patient has knee pain/ she needs appt with Dr Corliss Skainseveshwar / who wants to order a knee MRI scan for her. Please call her to make appointment for her   Dr Corliss Skainseveshwar has spoken to her already

## 2016-06-19 ENCOUNTER — Telehealth: Payer: Self-pay | Admitting: Radiology

## 2016-06-19 NOTE — Telephone Encounter (Signed)
Have discussed with patient. And Dr Corliss Skainseveshwar and Kathie RhodesBetty Patient will be worked in tomorrow at [employer] for her knee.

## 2016-06-20 ENCOUNTER — Encounter: Payer: Self-pay | Admitting: Rheumatology

## 2016-06-20 ENCOUNTER — Ambulatory Visit (INDEPENDENT_AMBULATORY_CARE_PROVIDER_SITE_OTHER): Payer: 59 | Admitting: Rheumatology

## 2016-06-20 ENCOUNTER — Ambulatory Visit (INDEPENDENT_AMBULATORY_CARE_PROVIDER_SITE_OTHER): Payer: 59

## 2016-06-20 VITALS — BP 145/73 | HR 61 | Resp 12 | Ht 64.0 in | Wt 163.0 lb

## 2016-06-20 DIAGNOSIS — M170 Bilateral primary osteoarthritis of knee: Secondary | ICD-10-CM

## 2016-06-20 DIAGNOSIS — M25561 Pain in right knee: Secondary | ICD-10-CM

## 2016-06-20 DIAGNOSIS — M2240 Chondromalacia patellae, unspecified knee: Secondary | ICD-10-CM | POA: Diagnosis not present

## 2016-06-20 DIAGNOSIS — M1711 Unilateral primary osteoarthritis, right knee: Secondary | ICD-10-CM | POA: Insufficient documentation

## 2016-06-20 NOTE — Patient Instructions (Signed)
Supplements for OA Natural anti-inflammatories  You can purchase these at [employer], [employer] or online.  . Turmeric (capsules)  . Ginger (ginger root or capsules)  . Omega 3 (Fish, flax seeds, chia seeds, walnuts, almonds)  . Tart cherry (dried or extract)   Patient should be under the care of a physician while taking these supplements. This may not be reproduced without the permission of Dr. Jonas Goh.  

## 2016-06-20 NOTE — Progress Notes (Signed)
*IMAGE* Office Visit Note  Patient: Alexandria Lawrence             Date of Birth: 05-26-1956           MRN: [id number]             PCP: Eartha InchBADGER,MICHAEL C, MD Referring: Eartha InchBadger, Michael C, MD Visit Date: 06/20/2016 Occupation:Social worker    Subjective:  Right lower extremity pain   History of Present Illness: Alexandria Lawrence is a 60 y.o. female with history of osteoarthritis of bilateral knee joints. She has had intermittent pain in her knee joints for the last few years. In the last 3 months she's been having increased pain in the lateral aspect of her right knee. Then the pain started radiating about her knee and below her knee into her calf muscle. We had called phone conversation regarding this. I gave her a prescription for physical therapy and she's been in physical therapy for the last 6 weeks without any significant improvement in her symptoms. She has been taking diclofenac and sometimes Aleve for pain relief. She's having nocturnal pain as well.  Activities of Daily Living:  Patient reports morning stiffness for 30 minutes.   Patient Reports nocturnal pain.  Difficulty dressing/grooming: Denies Difficulty climbing stairs: Denies Difficulty getting out of chair: Denies Difficulty using hands for taps, buttons, cutlery, and/or writing: Denies   Review of Systems  Constitutional: Positive for weight gain.  Cardiovascular: Positive for hypertension.  Musculoskeletal: Positive for arthralgias, joint pain and morning stiffness.  Psychiatric/Behavioral: Positive for sleep disturbance.    PMFS History:  Patient Active Problem List   Diagnosis Date Noted  . Unilateral primary osteoarthritis, right knee 06/20/2016  . 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 Headache(784.0) 11/10/2012  . Migraine, unspecified, without mention of intractable migraine without mention of status migrainosus 11/10/2012    Past Medical History:  Diagnosis Date  . Hypertension   . Migraine      Family History  Problem Relation Age of Onset  . Arthritis Mother   . Heart Problems Father   . Glaucoma Father   . Arthritis Father   . Cancer Maternal Aunt    Past Surgical History:  Procedure Laterality Date  . DILATION AND CURETTAGE OF UTERUS     Social History   Social History Narrative  . No narrative on file     Objective: Vital Signs: BP (!) 145/73 (BP Location: Left Arm, Patient Position: Sitting, Cuff Size: Large)   Pulse 61   Resp 12   Ht 5\' 4"  (1.626 m)   Wt 163 lb (73.9 kg)   BMI 27.98 kg/m    Physical Exam  Constitutional: She is oriented to person, place, and time. She appears well-developed and well-nourished.  HENT:  Head: Normocephalic and atraumatic.  Eyes: Conjunctivae and EOM are normal.  Neck: Normal range of motion.  Cardiovascular: Normal rate, regular rhythm, normal heart sounds and intact distal pulses.   Pulmonary/Chest: Effort normal and breath sounds normal.  Abdominal: Soft. Bowel sounds are normal.  Lymphadenopathy:    She has no cervical adenopathy.  Neurological: She is alert and oriented to person, place, and time.  Skin: Skin is warm and dry. Capillary refill takes less than 2 seconds.  Psychiatric: She has a normal mood and affect. Her behavior is normal.  Nursing note and vitals reviewed.    Musculoskeletal Exam: C-spine and thoracic lumbar spine good range of motion, shoulder joints although joints, wrist joints MCPs  PIPs DIPs were all good range of motion with no synovitis. Hip joints, knee joints, ankle joints, and MTPs and PIPs with good range of motion. She has tenderness on palpation over the lateral aspect of her right knee joint. She also had tenderness over right iliotibial band.  CDAI Exam: No CDAI exam completed.    Investigation: No additional findings.   Imaging: Xr Knee 3 View Right  Result Date: 06/20/2016 She has mild to moderate medial compartment narrowing, intercondylar osteophytes were noted, no  chondrocalcinosis was noted, she has mild patellofemoral narrowing. Impression: These findings are consistent with mild to moderate osteoarthritis and mild chondromalacia patella   Speciality Comments: No specialty comments available.    Procedures:  No procedures performed Allergies: Tramadol   Assessment / Plan: Visit Diagnoses:  Primary osteoarthritis of both knees: She has long-standing history of osteoarthritis of bilateral knee joints which cause some discomfort. She's been having increased pain in her right knee joint for the last 2-3 months now. No warmth or effusion was noted. X-ray today revealed only mild to moderate osteoarthritis and mild chondromalacia patella. The she's been this and discomfort for a long time with nocturnal pain and inadequate response to physical therapy of schedule MRI of her right knee joint to rule out internal derangement.  Right iliotibial band syndrome: She has tried physical therapy for last 6 weeks without much results. She is still having nocturnal pain and discomfort during routine activities. Ii have also advised to reduce the amount of anti-inflammatory she's taking.  I gave her a prescription for ionotophoresis for her right knee joint       Face-to-face time spent with patient was 20 minutes. 50% of time was spent in counseling and coordination of care.  Follow-Up Instructions: Return if symptoms worsen or fail to improve, for Osteoarthritis.   Pollyann SavoyShaili Jojo Geving, MD

## 2016-06-23 ENCOUNTER — Ambulatory Visit (HOSPITAL_COMMUNITY)
Admission: RE | Admit: 2016-06-23 | Discharge: 2016-06-23 | Disposition: A | Discharge status: Home / Self Care | Payer: 59 | Source: Ambulatory Visit | Attending: Rheumatology | Admitting: Rheumatology

## 2016-06-23 DIAGNOSIS — M25561 Pain in right knee: Secondary | ICD-10-CM

## 2016-06-23 DIAGNOSIS — M71561 Other bursitis, not elsewhere classified, right knee: Secondary | ICD-10-CM | POA: Insufficient documentation

## 2016-07-01 ENCOUNTER — Ambulatory Visit (INDEPENDENT_AMBULATORY_CARE_PROVIDER_SITE_OTHER): Payer: 59 | Admitting: Orthopaedic Surgery

## 2016-07-01 ENCOUNTER — Encounter (INDEPENDENT_AMBULATORY_CARE_PROVIDER_SITE_OTHER): Payer: Self-pay | Admitting: Orthopaedic Surgery

## 2016-07-01 VITALS — BP 148/83 | HR 64 | Resp 12 | Ht 64.0 in | Wt 160.0 lb

## 2016-07-01 DIAGNOSIS — M25561 Pain in right knee: Secondary | ICD-10-CM

## 2016-07-01 DIAGNOSIS — G8929 Other chronic pain: Secondary | ICD-10-CM

## 2016-07-01 MED ORDER — BUPIVACAINE HCL 0.5 % IJ SOLN
3.0000 mL | INTRAMUSCULAR | Status: AC | PRN
  Administered 2016-07-01: 3 mL via INTRA_ARTICULAR

## 2016-07-01 MED ORDER — LIDOCAINE HCL 1 % IJ SOLN
5.0000 mL | INTRAMUSCULAR | Status: AC | PRN
  Administered 2016-07-01: 5 mL

## 2016-07-01 MED ORDER — METHYLPREDNISOLONE ACETATE 40 MG/ML IJ SUSP
80.0000 mg | INTRAMUSCULAR | Status: AC | PRN
Start: 2016-07-01 — End: 2016-07-01
  Administered 2016-07-01: 80 mg

## 2016-07-01 NOTE — Progress Notes (Signed)
Office Visit Note   Patient: Alexandria Lawrence           Date of Birth: May 18, 1956           MRN: [id number] Visit Date: 07/01/2016              Requested by: Eartha InchMichael C Badger, MD [address] [place], [place] [address] PCP: Eartha InchBADGER,MICHAEL C, MD   Assessment & Plan: Visit Diagnoses:  1. Chronic pain of right knee     Plan: I think her right knee pain is a combination of several factors. I believe she is experiencing some referred pain from the medial meniscus tear and arthritis of the medial compartment the lateral joint. Think from a diagnostic and therapeutic standpoint is worth injecting her knee with cortisone and monitor her response in the next 2 weeks.  Follow-Up Instructions: Return in about 2 weeks (around 07/15/2016) for knee pain.   Orders:  No orders of the defined types were placed in this encounter.  No orders of the defined types were placed in this encounter.     Procedures: Large Joint Inj Date/Time: 07/01/2016 12:45 PM Performed by: Valeria BatmanWHITFIELD, Tahira Olivarez W Authorized by: Valeria BatmanWHITFIELD, Toini Failla W   Consent Given by:  Patient Timeout: prior to procedure the correct patient, procedure, and site was verified   Indications:  Pain and joint swelling Location:  Knee Site:  R knee Prep: patient was prepped and draped in usual sterile fashion   Needle Size:  25 G Needle Length:  1.5 inches Approach:  Anteromedial Ultrasound Guidance: No   Fluoroscopic Guidance: No   Arthrogram: No   Medications:  5 mL lidocaine 1 %; 80 mg methylPREDNISolone acetate 40 MG/ML; 3 mL bupivacaine 0.5 % Aspiration Attempted: No   Patient tolerance:  Patient tolerated the procedure well with no immediate complications     Clinical Data: No additional findings.   Subjective: Chief Complaint  Patient presents with  . Right Knee - Pain  . Right Leg - Pain    Alexandria Lawrence has seen Dr. Corliss Skainseveshwar for her right knee pain. He has been experiencing predominantly lateral joint pain without  obvious history of injury or trauma. MRI scan was performed 06/23/2016 demonstrating a complex inferior articular flap type tear at the posterior horn mid body junction with a small flipped meniscal fragment in the medial gutter was moderate degenerative chondrosis with early joint space narrowing medially. Mild degenerative chondrosis laterally. She has had some trouble sleeping and walking with minimal discomfort medially and mostly laterally along the lateral thigh and leg. She received to feel a "catch".    Review of Systems   Objective: Vital Signs: BP (!) 148/83   Pulse 64   Resp 12   Ht 5\' 4"  (1.626 m)   Wt 160 lb (72.6 kg)   BMI 27.46 kg/m   Physical Exam  Ortho Exam right knee demonstrates very minimal effusion. He was some medial joint pain at the junction of the posterior and medial thirds no popping or clicking. There was no patella pain was no pain laterally. No evidence of instability. There was full flexion and extension.  Specialty Comments:  No specialty comments available.  Imaging: No results found.   PMFS History: Patient Active Problem List   Diagnosis Date Noted  . Unilateral primary osteoarthritis, right knee 06/20/2016  . 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 Headache(784.0) 11/10/2012  . Migraine, unspecified, without mention of intractable migraine without mention of status migrainosus  11/10/2012   Past Medical History:  Diagnosis Date  . Hypertension   . Migraine     Family History  Problem Relation Age of Onset  . Arthritis Mother   . Heart Problems Father   . Glaucoma Father   . Arthritis Father   . Cancer Maternal Aunt     Past Surgical History:  Procedure Laterality Date  . DILATION AND CURETTAGE OF UTERUS     Social History   Occupational History  . Not on file.   Social History Main Topics  . Smoking status: Never Smoker  . Smokeless tobacco: Never Used  . Alcohol use 1.2 oz/week    2 Glasses of wine per  week  . Drug use: No  . Sexual activity: Not on file

## 2016-07-01 NOTE — Progress Notes (Deleted)
Pt had MRI ordered from Dr. Corliss Skainseveshwar and wants a consultaion with PW

## 2016-07-05 ENCOUNTER — Ambulatory Visit
Admission: RE | Admit: 2016-07-05 | Discharge: 2016-07-05 | Disposition: A | Discharge status: Home / Self Care | Payer: 59 | Source: Ambulatory Visit | Attending: Family Medicine | Admitting: Family Medicine

## 2016-07-05 DIAGNOSIS — Z1231 Encounter for screening mammogram for malignant neoplasm of breast: Secondary | ICD-10-CM

## 2016-07-09 ENCOUNTER — Ambulatory Visit: Payer: 59 | Admitting: Rheumatology

## 2016-07-15 ENCOUNTER — Ambulatory Visit (INDEPENDENT_AMBULATORY_CARE_PROVIDER_SITE_OTHER): Payer: 59 | Admitting: Orthopaedic Surgery

## 2016-07-17 ENCOUNTER — Ambulatory Visit (INDEPENDENT_AMBULATORY_CARE_PROVIDER_SITE_OTHER): Payer: 59 | Admitting: Orthopedic Surgery

## 2017-02-16 HISTORY — PX: KNEE SURGERY: SHX244

## 2019-12-27 NOTE — Progress Notes (Signed)
Office Visit Note  Patient: Alexandria Lawrence             Date of Birth: 12/21/1955           MRN: [id number]             PCP: Eartha Inch, MD Referring: Eartha Inch, MD Visit Date: 12/28/2019 Occupation: @GUAROCC @  Subjective:  Left shoulder pain.   History of Present Illness: Alexandria Lawrence is a 64 y.o. female returns today after her last visit in 2017.  She states that for the last 1-1/2 months she has been having discomfort in her left arm.  She is also experiencing numbness in her left hand.  She states the numbness gets worse when she is sleeping on her left side.  She also has some discomfort in her left shoulder..  She has been working on the computer for long hours.  None of the other joints are painful.  She had right knee joint meniscal tear surgery July 2018 and has recovered well from that.  Activities of Daily Living:  Patient reports morning stiffness for 1 hour.   Patient Reports nocturnal pain.  Difficulty dressing/grooming: Denies Difficulty climbing stairs: Denies Difficulty getting out of chair: Denies Difficulty using hands for taps, buttons, cutlery, and/or writing: Denies  Review of Systems  Constitutional: Negative for fatigue.  HENT: Positive for nose dryness. Negative for mouth sores and mouth dryness.   Eyes: Positive for dryness. Negative for itching.  Respiratory: Negative for shortness of breath and difficulty breathing.   Cardiovascular: Negative for chest pain and palpitations.  Gastrointestinal: Positive for constipation. Negative for blood in stool and diarrhea.  Endocrine: Negative for increased urination.  Genitourinary: Negative for difficulty urinating and painful urination.  Musculoskeletal: Positive for arthralgias, joint pain, myalgias, morning stiffness, muscle tenderness and myalgias. Negative for joint swelling.  Skin: Negative for rash, hair loss and redness.  Allergic/Immunologic: Negative for susceptible to infections.    Neurological: Positive for numbness. Negative for dizziness, headaches, memory loss and weakness.  Hematological: Negative for bruising/bleeding tendency.  Psychiatric/Behavioral: Negative for confusion.    PMFS History:  Patient Active Problem List   Diagnosis Date Noted  . Essential hypertension 12/28/2019  . Hx of migraines 12/28/2019  . Unilateral primary osteoarthritis, right knee 06/20/2016  . Headache(784.0) 11/10/2012  . Migraine headache 11/10/2012    Past Medical History:  Diagnosis Date  . Hypertension   . Migraine     Family History  Problem Relation Age of Onset  . Arthritis Mother   . Heart Problems Father   . Glaucoma Father   . Arthritis Father   . Cancer Maternal Aunt   . Healthy Sister   . Healthy Brother   . Healthy Sister   . Anemia Daughter   . Healthy Daughter    Past Surgical History:  Procedure Laterality Date  . DILATION AND CURETTAGE OF UTERUS    . KNEE SURGERY Right 02/2017   meniscus    Social History   Social History Narrative  . Not on file    There is no immunization history on file for this patient.   Objective: Vital Signs: BP 119/66 (BP Location: Right Arm, Patient Position: Sitting, Cuff Size: Normal)   Pulse 60   Resp 13   Ht 5' 2.25" (1.581 m)   Wt 158 lb (71.7 kg)   BMI 28.67 kg/m    Physical Exam Vitals and nursing note reviewed.  Constitutional:  Appearance: She is well-developed.  HENT:     Head: Normocephalic and atraumatic.  Eyes:     Conjunctiva/sclera: Conjunctivae normal.  Cardiovascular:     Rate and Rhythm: Normal rate and regular rhythm.     Heart sounds: Normal heart sounds.  Pulmonary:     Effort: Pulmonary effort is normal.     Breath sounds: Normal breath sounds.  Abdominal:     General: Bowel sounds are normal.     Palpations: Abdomen is soft.  Musculoskeletal:     Cervical back: Normal range of motion.  Lymphadenopathy:     Cervical: No cervical adenopathy.  Skin:    General: Skin  is warm and dry.     Capillary Refill: Capillary refill takes less than 2 seconds.  Neurological:     Mental Status: She is alert and oriented to person, place, and time.  Psychiatric:        Behavior: Behavior normal.      Musculoskeletal Exam: C-spine thoracic and lumbar spine with good range of motion.  She had discomfort range of motion of her left shoulder joint.  She has tenderness over subacromial region.  She also had some discomfort over the deltoid insertion.  Right shoulder joint was in good range of motion with no discomfort.  Bilateral elbow joints with good range of motion with no tendinitis.  She had no tenderness or swelling over her wrist joints MCPs or PIPs or DIPs.  Hip joints in good range of motion.  Knee joints and ankle joints in good range of motion with no swelling.  CDAI Exam: CDAI Score: -- Patient Global: --; Provider Global: -- Swollen: --; Tender: -- Joint Exam 12/28/2019   No joint exam has been documented for this visit   There is currently no information documented on the homunculus. Go to the Rheumatology activity and complete the homunculus joint exam.  Investigation: No additional findings.  Imaging: XR Shoulder Left  Result Date: 12/28/2019 Acromioclavicular joint space narrowing was noted.  No glenohumeral joint space narrowing was noted.  No chondrocalcinosis was noted. Impression: These findings are consistent with acromioclavicular arthritis.   Recent Labs: No results found for: WBC, HGB, PLT, NA, K, CL, CO2, GLUCOSE, BUN, CREATININE, BILITOT, ALKPHOS, AST, ALT, PROT, ALBUMIN, CALCIUM, GFRAA, QFTBGOLD, QFTBGOLDPLUS  Speciality Comments: No specialty comments available.  Procedures:  Large Joint Inj: L subacromial bursa on 12/28/2019 10:33 AM Indications: pain Details: 27 G 1.5 in needle, posterior approach  Arthrogram: No  Medications: 1 mL lidocaine 1 %; 40 mg triamcinolone acetonide 40 MG/ML Aspirate: 0 mL Outcome: tolerated well,  no immediate complications Procedure, treatment alternatives, risks and benefits explained, specific risks discussed. Consent was given by the patient. Immediately prior to procedure a time out was called to verify the correct patient, procedure, equipment, support staff and site/side marked as required. Patient was prepped and draped in the usual sterile fashion.     Allergies: Tramadol   Assessment / Plan:     Visit Diagnoses: Acute pain of left shoulder -she has been having pain and discomfort in left shoulder for last 1-1/2 months.  She had tenderness over left subacromial region.  Plan: XR Shoulder Left, Large Joint Inj: L.  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  The clinical findings are consistent with subacromial bursitis.  After informed consent was obtained and different options were discussed left subacromial bursa was injected with cortisone as described above.  She tolerated the procedure well.   Elbow pain, unspecified laterality  Primary osteoarthritis of both knees - Mild to moderate osteoarthritis of bilateral knee joints and mild chondromalacia patella.  Status post right knee joint meniscal tear repair.  Essential hypertension-blood pressure is well controlled.  Orders: Orders Placed This Encounter  Procedures  . Large Joint Inj: L subacromial bursa  . XR Shoulder Left   No orders of the defined types were placed in this encounter.   Face-to-face time spent with patient was 30 minutes. Greater than 50% of time was spent in counseling and coordination of care.  Follow-Up Instructions: Return if symptoms worsen or fail to improve, for Subacromial bursitis.   Pollyann Savoy, MD  Note - This record has been created using Animal nutritionist.  Chart creation errors have been sought, but may not always  have been located. Such creation errors do not reflect on  the standard of medical care.

## 2019-12-28 ENCOUNTER — Ambulatory Visit: Payer: Self-pay

## 2019-12-28 ENCOUNTER — Other Ambulatory Visit: Payer: Self-pay

## 2019-12-28 ENCOUNTER — Encounter: Payer: Self-pay | Admitting: Rheumatology

## 2019-12-28 ENCOUNTER — Encounter (INDEPENDENT_AMBULATORY_CARE_PROVIDER_SITE_OTHER): Payer: Self-pay

## 2019-12-28 ENCOUNTER — Ambulatory Visit: Payer: 59 | Admitting: Rheumatology

## 2019-12-28 VITALS — BP 119/66 | HR 60 | Resp 13 | Ht 62.25 in | Wt 158.0 lb

## 2019-12-28 DIAGNOSIS — M170 Bilateral primary osteoarthritis of knee: Secondary | ICD-10-CM | POA: Diagnosis not present

## 2019-12-28 DIAGNOSIS — G5731 Lesion of lateral popliteal nerve, right lower limb: Secondary | ICD-10-CM

## 2019-12-28 DIAGNOSIS — I10 Essential (primary) hypertension: Secondary | ICD-10-CM | POA: Diagnosis not present

## 2019-12-28 DIAGNOSIS — M25529 Pain in unspecified elbow: Secondary | ICD-10-CM | POA: Diagnosis not present

## 2019-12-28 DIAGNOSIS — M25512 Pain in left shoulder: Secondary | ICD-10-CM

## 2019-12-28 DIAGNOSIS — M1711 Unilateral primary osteoarthritis, right knee: Secondary | ICD-10-CM

## 2019-12-28 DIAGNOSIS — Z8669 Personal history of other diseases of the nervous system and sense organs: Secondary | ICD-10-CM

## 2019-12-28 MED ORDER — LIDOCAINE HCL 1 % IJ SOLN
1.0000 mL | INTRAMUSCULAR | Status: AC | PRN
  Administered 2019-12-28: 1 mL

## 2019-12-28 MED ORDER — TRIAMCINOLONE ACETONIDE 40 MG/ML IJ SUSP
40.0000 mg | INTRAMUSCULAR | Status: AC | PRN
  Administered 2019-12-28: 40 mg via INTRA_ARTICULAR

## 2019-12-28 NOTE — Patient Instructions (Signed)
Shoulder Exercises Ask your health care provider which exercises are safe for you. Do exercises exactly as told by your health care provider and adjust them as directed. It is normal to feel mild stretching, pulling, tightness, or discomfort as you do these exercises. Stop right away if you feel sudden pain or your pain gets worse. Do not begin these exercises until told by your health care provider. Stretching exercises External rotation and abduction This exercise is sometimes called corner stretch. This exercise rotates your arm outward (external rotation) and moves your arm out from your body (abduction). 1. Stand in a doorway with one of your feet slightly in front of the other. This is called a staggered stance. If you cannot reach your forearms to the door frame, stand facing a corner of a room. 2. Choose one of the following positions as told by your health care provider: ? Place your hands and forearms on the door frame above your head. ? Place your hands and forearms on the door frame at the height of your head. ? Place your hands on the door frame at the height of your elbows. 3. Slowly move your weight onto your front foot until you feel a stretch across your chest and in the front of your shoulders. Keep your head and chest upright and keep your abdominal muscles tight. 4. Hold for __________ seconds. 5. To release the stretch, shift your weight to your back foot. Repeat __________ times. Complete this exercise __________ times a day. Extension, standing 1. Stand and hold a broomstick, a cane, or a similar object behind your back. ? Your hands should be a little wider than shoulder width apart. ? Your palms should face away from your back. 2. Keeping your elbows straight and your shoulder muscles relaxed, move the stick away from your body until you feel a stretch in your shoulders (extension). ? Avoid shrugging your shoulders while you move the stick. Keep your shoulder blades tucked  down toward the middle of your back. 3. Hold for __________ seconds. 4. Slowly return to the starting position. Repeat __________ times. Complete this exercise __________ times a day. Range-of-motion exercises Pendulum  1. Stand near a wall or a surface that you can hold onto for balance. 2. Bend at the waist and let your left / right arm hang straight down. Use your other arm to support you. Keep your back straight and do not lock your knees. 3. Relax your left / right arm and shoulder muscles, and move your hips and your trunk so your left / right arm swings freely. Your arm should swing because of the motion of your body, not because you are using your arm or shoulder muscles. 4. Keep moving your hips and trunk so your arm swings in the following directions, as told by your health care provider: ? Side to side. ? Forward and backward. ? In clockwise and counterclockwise circles. 5. Continue each motion for __________ seconds, or for as long as told by your health care provider. 6. Slowly return to the starting position. Repeat __________ times. Complete this exercise __________ times a day. Shoulder flexion, standing  1. Stand and hold a broomstick, a cane, or a similar object. Place your hands a little more than shoulder width apart on the object. Your left / right hand should be palm up, and your other hand should be palm down. 2. Keep your elbow straight and your shoulder muscles relaxed. Push the stick up with your healthy arm to   raise your left / right arm in front of your body, and then over your head until you feel a stretch in your shoulder (flexion). ? Avoid shrugging your shoulder while you raise your arm. Keep your shoulder blade tucked down toward the middle of your back. 3. Hold for __________ seconds. 4. Slowly return to the starting position. Repeat __________ times. Complete this exercise __________ times a day. Shoulder abduction, standing 1. Stand and hold a broomstick,  a cane, or a similar object. Place your hands a little more than shoulder width apart on the object. Your left / right hand should be palm up, and your other hand should be palm down. 2. Keep your elbow straight and your shoulder muscles relaxed. Push the object across your body toward your left / right side. Raise your left / right arm to the side of your body (abduction) until you feel a stretch in your shoulder. ? Do not raise your arm above shoulder height unless your health care provider tells you to do that. ? If directed, raise your arm over your head. ? Avoid shrugging your shoulder while you raise your arm. Keep your shoulder blade tucked down toward the middle of your back. 3. Hold for __________ seconds. 4. Slowly return to the starting position. Repeat __________ times. Complete this exercise __________ times a day. Internal rotation  1. Place your left / right hand behind your back, palm up. 2. Use your other hand to dangle an exercise band, a towel, or a similar object over your shoulder. Grasp the band with your left / right hand so you are holding on to both ends. 3. Gently pull up on the band until you feel a stretch in the front of your left / right shoulder. The movement of your arm toward the center of your body is called internal rotation. ? Avoid shrugging your shoulder while you raise your arm. Keep your shoulder blade tucked down toward the middle of your back. 4. Hold for __________ seconds. 5. Release the stretch by letting go of the band and lowering your hands. Repeat __________ times. Complete this exercise __________ times a day. Strengthening exercises External rotation  1. Sit in a stable chair without armrests. 2. Secure an exercise band to a stable object at elbow height on your left / right side. 3. Place a soft object, such as a folded towel or a small pillow, between your left / right upper arm and your body to move your elbow about 4 inches (10 cm) away  from your side. 4. Hold the end of the exercise band so it is tight and there is no slack. 5. Keeping your elbow pressed against the soft object, slowly move your forearm out, away from your abdomen (external rotation). Keep your body steady so only your forearm moves. 6. Hold for __________ seconds. 7. Slowly return to the starting position. Repeat __________ times. Complete this exercise __________ times a day. Shoulder abduction  1. Sit in a stable chair without armrests, or stand up. 2. Hold a __________ weight in your left / right hand, or hold an exercise band with both hands. 3. Start with your arms straight down and your left / right palm facing in, toward your body. 4. Slowly lift your left / right hand out to your side (abduction). Do not lift your hand above shoulder height unless your health care provider tells you that this is safe. ? Keep your arms straight. ? Avoid shrugging your shoulder while you   do this movement. Keep your shoulder blade tucked down toward the middle of your back. 5. Hold for __________ seconds. 6. Slowly lower your arm, and return to the starting position. Repeat __________ times. Complete this exercise __________ times a day. Shoulder extension 1. Sit in a stable chair without armrests, or stand up. 2. Secure an exercise band to a stable object in front of you so it is at shoulder height. 3. Hold one end of the exercise band in each hand. Your palms should face each other. 4. Straighten your elbows and lift your hands up to shoulder height. 5. Step back, away from the secured end of the exercise band, until the band is tight and there is no slack. 6. Squeeze your shoulder blades together as you pull your hands down to the sides of your thighs (extension). Stop when your hands are straight down by your sides. Do not let your hands go behind your body. 7. Hold for __________ seconds. 8. Slowly return to the starting position. Repeat __________ times.  Complete this exercise __________ times a day. Shoulder row 1. Sit in a stable chair without armrests, or stand up. 2. Secure an exercise band to a stable object in front of you so it is at waist height. 3. Hold one end of the exercise band in each hand. Position your palms so that your thumbs are facing the ceiling (neutral position). 4. Bend each of your elbows to a 90-degree angle (right angle) and keep your upper arms at your sides. 5. Step back until the band is tight and there is no slack. 6. Slowly pull your elbows back behind you. 7. Hold for __________ seconds. 8. Slowly return to the starting position. Repeat __________ times. Complete this exercise __________ times a day. Shoulder press-ups  1. Sit in a stable chair that has armrests. Sit upright, with your feet flat on the floor. 2. Put your hands on the armrests so your elbows are bent and your fingers are pointing forward. Your hands should be about even with the sides of your body. 3. Push down on the armrests and use your arms to lift yourself off the chair. Straighten your elbows and lift yourself up as much as you comfortably can. ? Move your shoulder blades down, and avoid letting your shoulders move up toward your ears. ? Keep your feet on the ground. As you get stronger, your feet should support less of your body weight as you lift yourself up. 4. Hold for __________ seconds. 5. Slowly lower yourself back into the chair. Repeat __________ times. Complete this exercise __________ times a day. Wall push-ups  1. Stand so you are facing a stable wall. Your feet should be about one arm-length away from the wall. 2. Lean forward and place your palms on the wall at shoulder height. 3. Keep your feet flat on the floor as you bend your elbows and lean forward toward the wall. 4. Hold for __________ seconds. 5. Straighten your elbows to push yourself back to the starting position. Repeat __________ times. Complete this exercise  __________ times a day. This information is not intended to replace advice given to you by your health care provider. Make sure you discuss any questions you have with your health care provider. Document Revised: 11/27/2018 Document Reviewed: 09/04/2018 Elsevier Patient Education  2020 [employer]  

## 2020-01-28 ENCOUNTER — Other Ambulatory Visit: Payer: Self-pay | Admitting: *Deleted

## 2020-01-28 DIAGNOSIS — M67912 Unspecified disorder of synovium and tendon, left shoulder: Secondary | ICD-10-CM

## 2020-01-28 DIAGNOSIS — M25512 Pain in left shoulder: Secondary | ICD-10-CM

## 2020-01-28 NOTE — Progress Notes (Signed)
Per Dr. Corliss Skains place order for physical therapy for left shoulder joint for tendinopathy with Dewaine Conger.

## 2021-01-04 ENCOUNTER — Emergency Department (HOSPITAL_COMMUNITY): Payer: No Typology Code available for payment source

## 2021-01-04 ENCOUNTER — Emergency Department (HOSPITAL_COMMUNITY)
Admission: EM | Admit: 2021-01-04 | Discharge: 2021-01-04 | Disposition: A | Discharge status: Home / Self Care | Payer: No Typology Code available for payment source | Attending: Emergency Medicine | Admitting: Emergency Medicine

## 2021-01-04 DIAGNOSIS — W228XXA Striking against or struck by other objects, initial encounter: Secondary | ICD-10-CM | POA: Insufficient documentation

## 2021-01-04 DIAGNOSIS — M25512 Pain in left shoulder: Secondary | ICD-10-CM | POA: Insufficient documentation

## 2021-01-04 DIAGNOSIS — Z79899 Other long term (current) drug therapy: Secondary | ICD-10-CM | POA: Insufficient documentation

## 2021-01-04 DIAGNOSIS — I10 Essential (primary) hypertension: Secondary | ICD-10-CM | POA: Insufficient documentation

## 2021-01-04 DIAGNOSIS — W19XXXA Unspecified fall, initial encounter: Secondary | ICD-10-CM

## 2021-01-04 DIAGNOSIS — S1081XA Abrasion of other specified part of neck, initial encounter: Secondary | ICD-10-CM | POA: Diagnosis not present

## 2021-01-04 DIAGNOSIS — S199XXA Unspecified injury of neck, initial encounter: Secondary | ICD-10-CM | POA: Diagnosis present

## 2021-01-04 DIAGNOSIS — Y990 Civilian activity done for income or pay: Secondary | ICD-10-CM | POA: Insufficient documentation

## 2021-01-04 LAB — RAPID URINE DRUG SCREEN, HOSP PERFORMED
Amphetamines: NOT DETECTED
Barbiturates: NOT DETECTED
Benzodiazepines: NOT DETECTED
Cocaine: NOT DETECTED
Opiates: NOT DETECTED
Tetrahydrocannabinol: NOT DETECTED

## 2021-01-04 MED ORDER — CYCLOBENZAPRINE HCL 10 MG PO TABS: 10.0000 mg | ORAL_TABLET | Freq: Three times a day (TID) | ORAL | 0 refills | Status: AC | PRN

## 2021-01-04 NOTE — ED Triage Notes (Signed)
Pt bib ems from work, pt with mechanical fall, pat hit L shoulder on door, pain between scapula and shoulder. Unable to Use shoulder to move her arm. Pain worsens with palpation, no deformity noted. CNS intact. VSS. Denies LOC, no blood thinners.

## 2021-01-04 NOTE — ED Provider Notes (Signed)
Emergency Medicine Provider Triage Evaluation Note  Alexandria Lawrence , a 65 y.o. female  was evaluated in triage.  Pt complains of mechanical fall. Stumbled at work, fell onto left shoulder. Cant lift left shoulder, pain worse with palpations. No LOC, not on blood thinners. No CP, light headed, dizziness at the time of the fall.  She does endorse hitting the left side of her head, but did not lose consciousness.  She complains of left shoulder pain and weakness.  Unable to lift left shoulder above midline.  Denies any numbness or tingling in her upper or lower extremities.  She also complains of some neck pain and left scapular pain.  Alert and oriented x3  Review of Systems  Positive: As above Negative: As above  Physical Exam  There were no vitals taken for this visit. Gen:   Awake, no distress  Resp:  Normal effort  MSK:   Moves extremities without difficulty  Other:  Patient with no cervical midline tenderness.  C-collar removed.  Patient is neurovascularly tact in her upper and lower extremities.  She does have slightly limited range of motion of the left shoulder, able to lift above midline.  2+ radial pulses.  Sensations intact.  Patient with some left-sided trapezial/cervical paraspinal muscle tenderness.    Medical Decision Making  Medically screening exam initiated at 2:10 PM.  Appropriate orders placed.  Allyse PATTRICIA WEIHER was informed that the remainder of the evaluation will be completed by another provider, this initial triage assessment does not replace that evaluation, and the importance of remaining in the ED until their evaluation is complete.     Mare Ferrari, PA-C 01/04/21 1429    Melene Plan, DO 01/04/21 1459

## 2021-01-04 NOTE — Discharge Instructions (Signed)
Follow-up with your doctor for any continued left shoulder pain or difficulty using her left arm.  For severe pain, passing out, severe headache, or other emergencies please, keep

## 2021-01-04 NOTE — ED Provider Notes (Signed)
[facility] EMERGENCY DEPARTMENT Provider Note   CSN: [id number] Arrival date & time: 01/04/21  1413     History No chief complaint on file.   Alexandria Lawrence is a 65 y.o. female.  HPI  Patient presents with fall, injury.  She fell while turning in the break room and struck the left side of her head on an open door.  No loss of conscious.  No headache, visual disturbance, neck pain, numbness, tingling, weakness at this time.  She complains of left shoulder pain and localizes this to the back of her shoulder over her scapula.  She has had difficulty moving her arm but this seems to be improving.  No history of blood thinner use.     Past Medical History:  Diagnosis Date  . Hypertension   . Migraine     Patient Active Problem List   Diagnosis Date Noted  . Essential hypertension 12/28/2019  . Hx of migraines 12/28/2019  . Unilateral primary osteoarthritis, right knee 06/20/2016  . Headache(784.0) 11/10/2012  . Migraine headache 11/10/2012    Past Surgical History:  Procedure Laterality Date  . DILATION AND CURETTAGE OF UTERUS    . KNEE SURGERY Right 02/2017   meniscus      OB History   No obstetric history on file.     Family History  Problem Relation Age of Onset  . Arthritis Mother   . Heart Problems Father   . Glaucoma Father   . Arthritis Father   . Cancer Maternal Aunt   . Healthy Sister   . Healthy Brother   . Healthy Sister   . Anemia Daughter   . Healthy Daughter     Social History   Tobacco Use  . Smoking status: Never Smoker  . Smokeless tobacco: Never Used  Vaping Use  . Vaping Use: Never used  Substance Use Topics  . Alcohol use: Yes    Alcohol/week: 2.0 standard drinks    Types: 2 Glasses of wine per week    Comment: occ  . Drug use: No    Home Medications Prior to Admission medications   Medication Sig Start Date End Date Taking? Authorizing Provider  acetaminophen (TYLENOL) 500 MG tablet Take 1,000 mg by mouth  every 6 (six) hours as needed for moderate pain.   Yes [provider]  co-enzyme Q-10 30 MG capsule Take 30 mg by mouth daily.   Yes [provider]  cyclobenzaprine (FLEXERIL) 10 MG tablet Take 1 tablet (10 mg total) by mouth 3 (three) times daily as needed for up to 10 days for muscle spasms. 01/04/21 01/14/21 Yes Jacklynn Bue, MD  estradiol-norethindrone [facility]) 0.05-0.25 MG/DAY Place 1 patch onto the skin 2 (two) times a week.   Yes [provider]  olmesartan (BENICAR) 40 MG tablet Take 40 mg by mouth daily. 11/21/19  Yes [provider]  omeprazole (PRILOSEC) 40 MG capsule Take 40 mg by mouth daily. 06/10/16  Yes [provider]  rosuvastatin (CRESTOR) 10 MG tablet Take 10 mg by mouth at bedtime. 10/27/20  Yes [provider]  verapamil (VERELAN PM) 180 MG 24 hr capsule Take 180 mg by mouth at bedtime. 06/10/16  Yes [provider]  Vitamin D, Ergocalciferol, (DRISDOL) 50000 units CAPS capsule Take 50,000 Units by mouth every 7 (seven) days. Sundays 06/10/16  Yes [provider]    Allergies    Tramadol  Review of Systems   Review of Systems  Constitutional: Negative for  chills and fever.  HENT: Negative for ear pain and sore throat.   Eyes: Negative for pain and visual disturbance.  Respiratory: Negative for cough and shortness of breath.   Cardiovascular: Negative for chest pain and palpitations.  Gastrointestinal: Negative for abdominal pain and vomiting.  Genitourinary: Negative for dysuria and hematuria.  Musculoskeletal: Negative for arthralgias and back pain.  Skin: Negative for color change and rash.  Neurological: Negative for seizures and syncope.  All other systems reviewed and are negative.   Physical Exam Updated Vital Signs BP (!) 156/78   Pulse 70   Temp 98.4 F (36.9 C) (Oral)   Resp 16   SpO2 99%   Physical Exam Vitals and nursing note reviewed.  Constitutional:      General:  She is not in acute distress.    Appearance: She is well-developed.  HENT:     Head: Normocephalic.  Eyes:     Conjunctiva/sclera: Conjunctivae normal.  Neck:     Comments: Small abrasion to L neck no open injuries Cardiovascular:     Rate and Rhythm: Normal rate and regular rhythm.     Heart sounds: No murmur heard.     Comments: No carotid bruits Pulmonary:     Effort: Pulmonary effort is normal. No respiratory distress.     Breath sounds: Normal breath sounds.  Abdominal:     Palpations: Abdomen is soft.     Tenderness: There is no abdominal tenderness.  Musculoskeletal:     Cervical back: Neck supple.  Skin:    General: Skin is warm and dry.  Neurological:     General: No focal deficit present.     Mental Status: She is alert.     Sensory: No sensory deficit.     Motor: No weakness.     Coordination: Coordination normal.  Psychiatric:        Thought Content: Thought content normal.        Judgment: Judgment normal.   Scalp atraumatic Forehead stable to palpation PERRL EOMI bl Midface stable nontender No intraoral injury  C-spine nontender T-spine nontender L-spine nontender No stepoffs or deformity  Clavicles stable nontender bilaterally Chest wall stable to AP and lat compression Abdomen nontender Bilateral radial pulses, bilateral DP pulses intact Left hand with light touch sensation intact, full range of motion, handgrip 5/5.  Empty can test negative.  ED Results / Procedures / Treatments   Labs (all labs ordered are listed, but only abnormal results are displayed) Labs Reviewed  RAPID URINE DRUG SCREEN, HOSP PERFORMED    EKG None  Radiology DG Scapula Left  Result Date: 01/04/2021 CLINICAL DATA:  Status post fall. EXAM: LEFT SCAPULA - 2+ VIEWS COMPARISON:  None. FINDINGS: There is no evidence of fracture or other focal bone lesions. Soft tissues are unremarkable. IMPRESSION: Negative. Electronically Signed   By: Aram Candela M.D.   On:  01/04/2021 15:37   DG Shoulder Left  Result Date: 01/04/2021 CLINICAL DATA:  Left shoulder pain after fall while at work EXAM: LEFT SHOULDER - 2+ VIEW COMPARISON:  12/28/2019 FINDINGS: No signs of acute fracture or dislocation. There is no radio-opaque foreign body or soft tissue calcifications identified. Mild degenerative changes are noted at the [facility] joint. IMPRESSION: 1. No acute abnormality. 2. AC joint degenerative change. Electronically Signed   By: Signa Kell M.D.   On: 01/04/2021 15:34    Procedures Procedures   Medications Ordered in ED Medications - No data to display  ED Course  I  have reviewed the triage vital signs and the nursing notes.  Pertinent labs & imaging results that were available during my care of the patient were reviewed by me and considered in my medical decision making (see chart for details).    MDM Rules/Calculators/A&P                          Patient presents for follow-up.  Neck exam is reassuring; patient has full range of motion of the neck without midline pain.  Left Rib tenderness but x-rays negative for fracture no signs of rotator cuff injury or neurovascular compromise of the left arm.  No loss of consciousness or signs of severe head injury such as blurry vision, headache, focal weakness, or other emergency.  UDS obtained per patient request; patient stable and appropriate for discharge.  Disposition without acute event. Final Clinical Impression(s) / ED Diagnoses Final diagnoses:  Fall, initial encounter    Rx / DC Orders ED Discharge Orders         Ordered    cyclobenzaprine (FLEXERIL) 10 MG tablet  3 times daily PRN        01/04/21 2146           Jacklynn Bue, MD 01/05/21 1529    Tegeler, Canary Brim, MD 01/06/21 1150

## 2021-01-04 NOTE — ED Notes (Signed)
Patient verbalized understanding of discharge instructions. Opportunity for questions and answers.  

## 2021-11-24 IMAGING — CR DG SCAPULA*L*
3 series · 3 of 3 positions shown · non-contrast
Comparison: None.

CLINICAL DATA: Status post fall.

EXAM:
LEFT SCAPULA - 2+ VIEWS

[scapula ap]
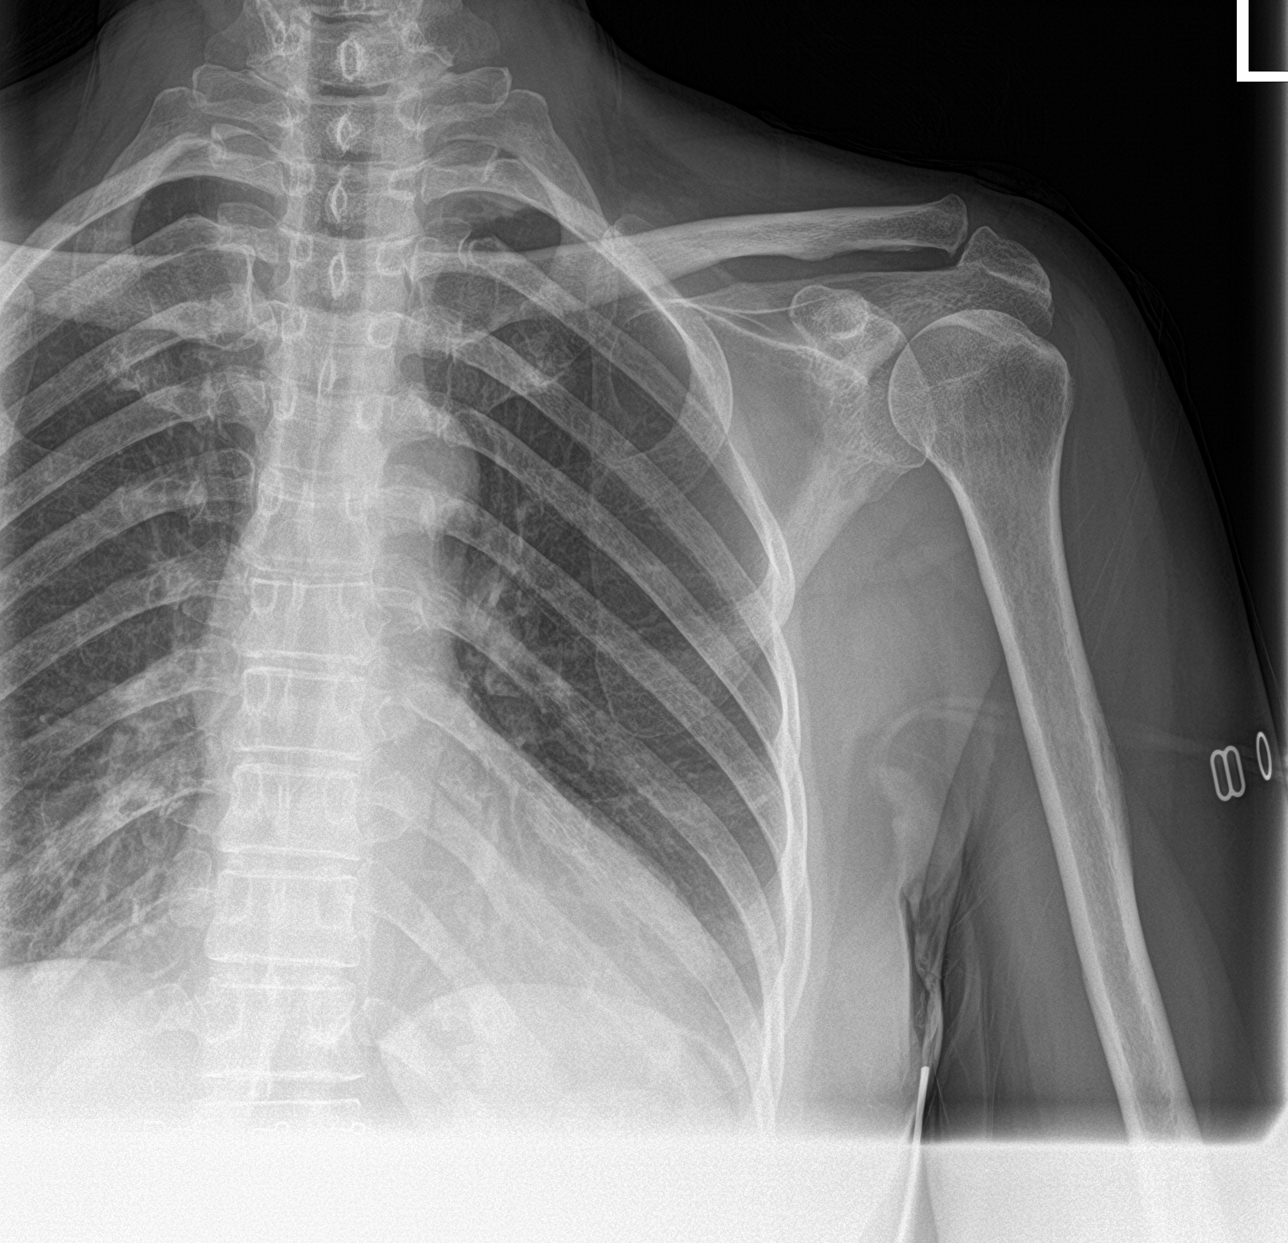

[scapula lat (1 of 2)]
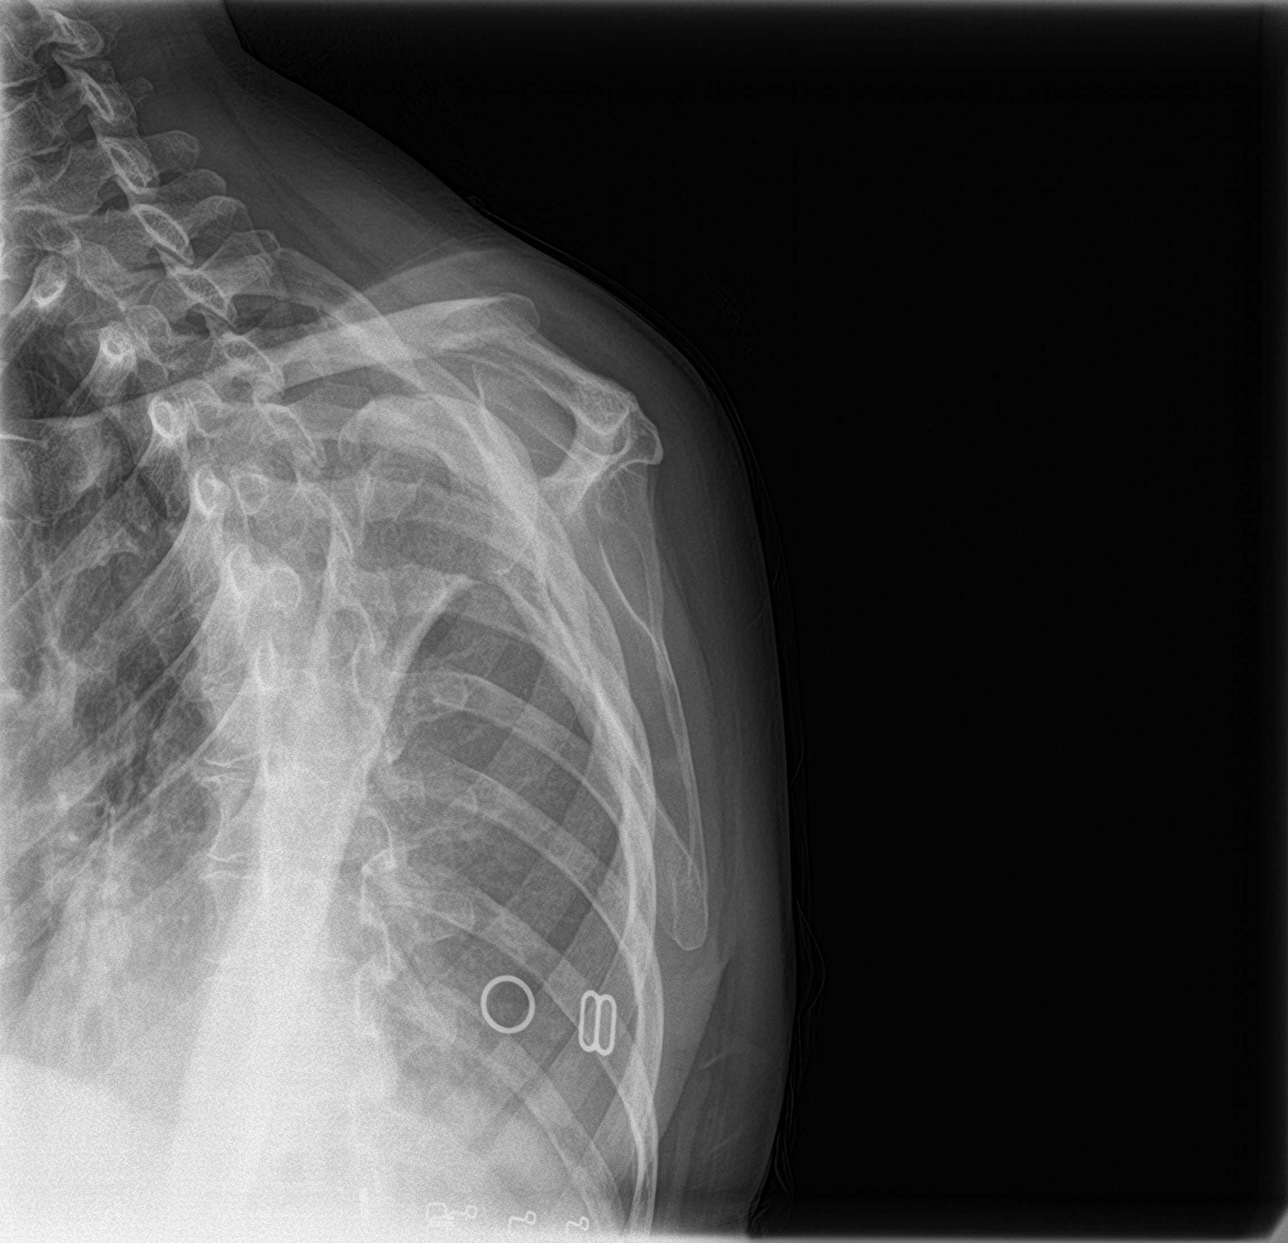

[scapula lat (2 of 2)]
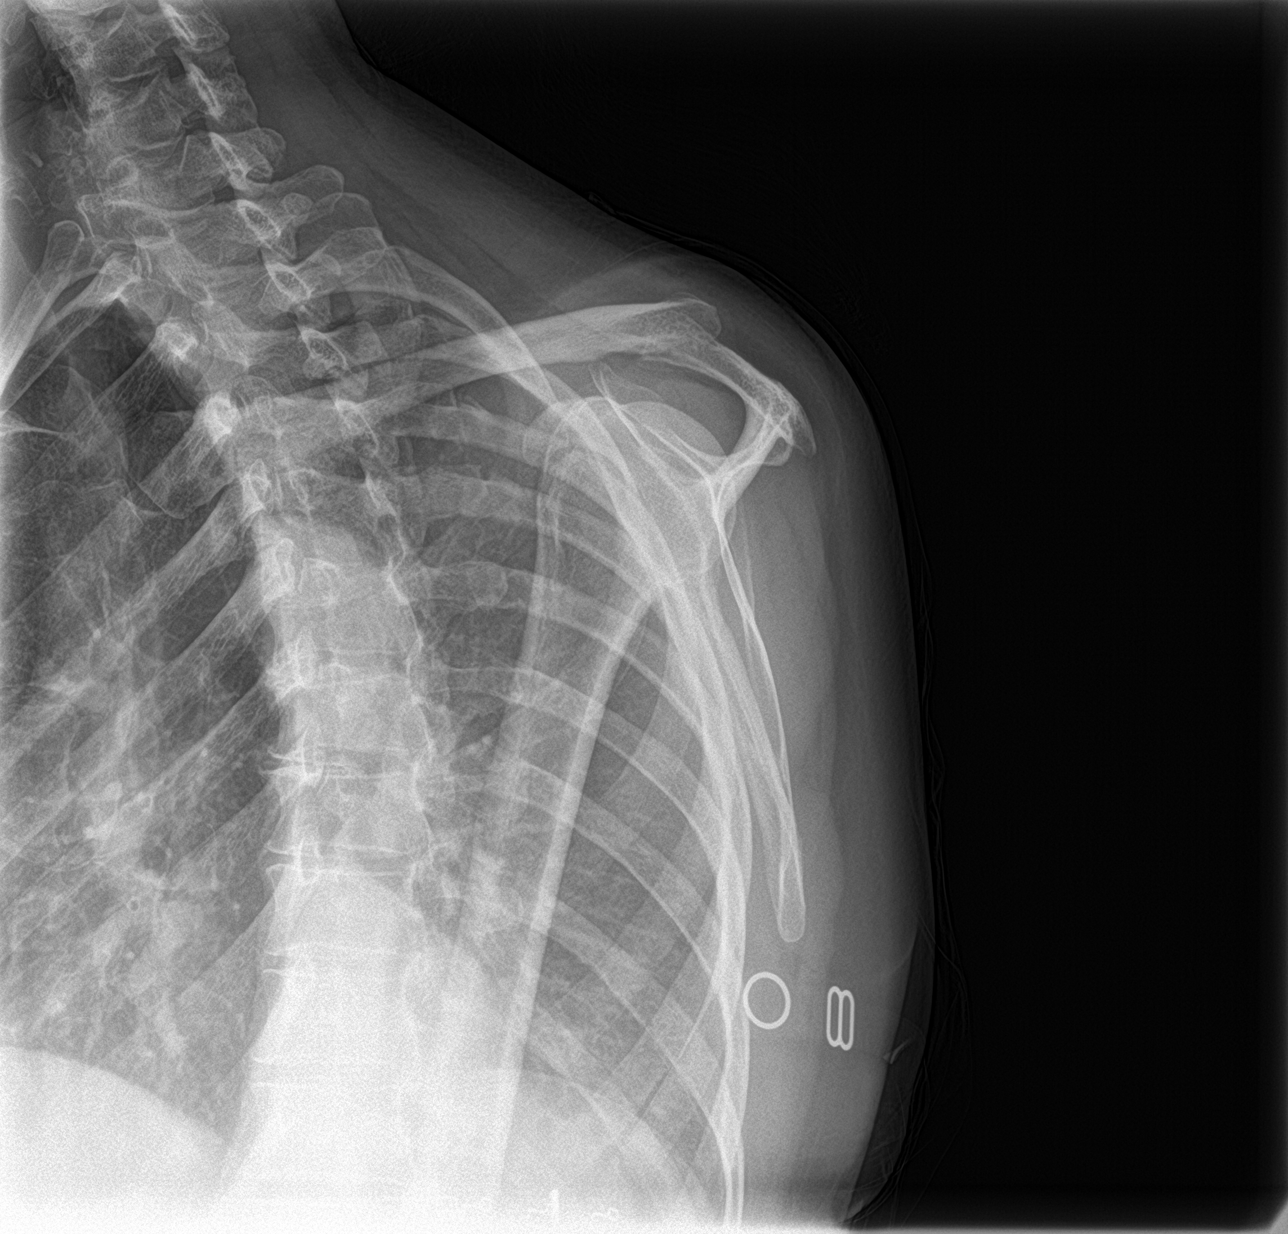

[3 of 3 positions shown; findings below may reference images not displayed]

FINDINGS: There is no evidence of fracture or other focal bone lesions. Soft
tissues are unremarkable.
IMPRESSION: Negative.
